# Patient Record
Sex: Male | Born: 1975 | Race: Black or African American | Hispanic: No | Marital: Married | Smoking: Current every day smoker
Health system: Southern US, Community
[De-identification: ages and names within clinical notes are randomized; demographics above are authoritative.]

## PROBLEM LIST (undated history)

## (undated) DIAGNOSIS — T7840XA Allergy, unspecified, initial encounter: Secondary | ICD-10-CM

## (undated) DIAGNOSIS — D573 Sickle-cell trait: Secondary | ICD-10-CM

## (undated) HISTORY — DX: Allergy, unspecified, initial encounter: T78.40XA

## (undated) HISTORY — PX: UMBILICAL HERNIA REPAIR: SHX196

---

## 2012-12-05 HISTORY — PX: ENUCLEATION: SHX628

## 2015-10-25 ENCOUNTER — Emergency Department (HOSPITAL_COMMUNITY): Payer: Self-pay

## 2015-10-25 ENCOUNTER — Emergency Department (HOSPITAL_COMMUNITY)
Admission: EM | Admit: 2015-10-25 | Discharge: 2015-10-25 | Disposition: A | Discharge status: Home / Self Care | Payer: Self-pay | Attending: Emergency Medicine | Admitting: Emergency Medicine

## 2015-10-25 ENCOUNTER — Encounter (HOSPITAL_COMMUNITY): Payer: Self-pay | Admitting: Emergency Medicine

## 2015-10-25 DIAGNOSIS — W228XXA Striking against or struck by other objects, initial encounter: Secondary | ICD-10-CM | POA: Insufficient documentation

## 2015-10-25 DIAGNOSIS — Y9389 Activity, other specified: Secondary | ICD-10-CM | POA: Insufficient documentation

## 2015-10-25 DIAGNOSIS — Y929 Unspecified place or not applicable: Secondary | ICD-10-CM | POA: Insufficient documentation

## 2015-10-25 DIAGNOSIS — S62339A Displaced fracture of neck of unspecified metacarpal bone, initial encounter for closed fracture: Secondary | ICD-10-CM

## 2015-10-25 DIAGNOSIS — S62306A Unspecified fracture of fifth metacarpal bone, right hand, initial encounter for closed fracture: Secondary | ICD-10-CM | POA: Insufficient documentation

## 2015-10-25 DIAGNOSIS — Y999 Unspecified external cause status: Secondary | ICD-10-CM | POA: Insufficient documentation

## 2015-10-25 MED ORDER — NAPROXEN 250 MG PO TABS: 250.0000 mg | ORAL_TABLET | Freq: Two times a day (BID) | ORAL | 0 refills | Status: DC

## 2015-10-25 NOTE — ED Notes (Signed)
Patient transported to X-ray 

## 2015-10-25 NOTE — ED Notes (Signed)
Bed: [id number] Expected date:  Expected time:  Means of arrival:  Comments: 40 yo sore throat

## 2015-10-25 NOTE — ED Notes (Signed)
Bed: WA27 Expected date:  Expected time:  Means of arrival:  Comments: 

## 2015-10-25 NOTE — ED Triage Notes (Signed)
Pt reports that he punched a cabinet Thursday.  Reports no pain but does still have some swelling.  Pt has full ROM with hand and wrist.

## 2015-10-25 NOTE — ED Provider Notes (Signed)
[facility]-EMERGENCY DEPT Provider Note   CSN: [id number] Arrival date & time: 10/25/15  1248   By signing my name below, I, Christel MormonMatthew Jamison, attest that this documentation has been prepared under the direction and in the presence of Everlene FarrierWilliam Mariel Gaudin, PA-C . Electronically Signed: Christel MormonMatthew Jamison, Scribe. 10/25/2015. 1:03 PM.   History   Chief Complaint Chief Complaint  Patient presents with  . Hand Injury    The history is provided by the patient. No language interpreter was used.    History reviewed. No pertinent past medical history.  There are no active problems to display for this patient.   History reviewed. No pertinent surgical history.   HPI Comments:  Kevin Burch is a 40 y.o. male who presents to the Emergency Department sudden onset, unchanged, moderate right hand injury onset 3 days ago after he punched a cabinet. Pt has associated right hand swelling. He does not have any current pain.  He is here due to punching a cabinet 3 days ago. Pt reports no pain currently. Pt notes using ice with no relief.  He has not taken any medication to alleviate his pain. Pt denies numbness, tingling, or weakness.       Home Medications    Prior to Admission medications   Medication Sig Start Date End Date Taking? Authorizing Provider  naproxen (NAPROSYN) 250 MG tablet Take 1 tablet (250 mg total) by mouth 2 (two) times daily with a meal. 10/25/15   Everlene FarrierWilliam Kady Toothaker, PA-C    Family History History reviewed. No pertinent family history.  Social History Social History  Substance Use Topics  . Smoking status: Not on file  . Smokeless tobacco: Not on file  . Alcohol use Not on file     Allergies   Review of patient's allergies indicates no known allergies.   Review of Systems Review of Systems  Musculoskeletal: Positive for arthralgias and joint swelling.  Neurological: Negative for weakness and numbness.     Physical Exam Updated Vital Signs BP 150/93 (BP Location:  Right Arm)   Pulse 88   Temp 97.8 F (36.6 C) (Oral)   Resp 16   Ht 5\' 11"  (1.803 m)   Wt 88.5 kg   SpO2 100%   BMI 27.20 kg/m   Physical Exam  Constitutional: He appears well-developed and well-nourished. No distress.  HENT:  Head: Normocephalic and atraumatic.  Eyes: Right eye exhibits no discharge. Left eye exhibits no discharge.  Cardiovascular: Normal rate, regular rhythm and intact distal pulses.   Bilateral radial pulses are intact.  Pulmonary/Chest: Effort normal. No respiratory distress.  Musculoskeletal: Normal range of motion. He exhibits edema (3rd, 4th, 5th R metacarpels). He exhibits no tenderness or deformity.  Can make a fist. R knuckles in good alignment.  There is mild edema overlying the fifth metacarpal. No right hand ecchymosis or erythema. No obvious deformity.  Neurological: He is alert. Coordination normal.  Sensation is intact in his bilateral distal fingertips.  Skin: Skin is warm and dry. Capillary refill takes less than 2 seconds. Good cap refill. No rash noted. He is not diaphoretic. No erythema. No pallor.  Psychiatric: He has a normal mood and affect. His behavior is normal.  Nursing note and vitals reviewed.    ED Treatments / Results  DIAGNOSTIC STUDIES:  Oxygen Saturation is 100% on RA, normal by my interpretation.    COORDINATION OF CARE:  1:03 PM Will order an x-ray on right hand. Discussed treatment plan with pt at bedside and pt agreed  to plan.  Labs (all labs ordered are listed, but only abnormal results are displayed) Labs Reviewed - No data to display  EKG  EKG Interpretation None       Radiology Dg Hand Complete Right  Result Date: 10/25/2015 CLINICAL DATA:  Swelling of the right fifth metacarpal. Punched wall 3 days ago. EXAM: RIGHT HAND - COMPLETE 3+ VIEW COMPARISON:  None. FINDINGS: Typical boxer's type fracture of the diaphyseal metaphyseal junction of the fifth metacarpal with dorsal angulation. No other regional  fracture. IMPRESSION: Typical boxer's fracture of the fifth metacarpal. Electronically Signed   By: Paulina FusiMark  Shogry M.D.   On: 10/25/2015 13:49    Procedures Procedures (including critical care time)  Medications Ordered in ED Medications - No data to display   Initial Impression / Assessment and Plan / ED Course  I have reviewed the triage vital signs and the nursing notes.  Pertinent labs & imaging results that were available during my care of the patient were reviewed by me and considered in my medical decision making (see chart for details).  Clinical Course    Patient presents to the emergency department complaining of right hand pain after he punched a wall 3 days ago. X-ray shows boxer's fracture. On exam patient has mild edema overlying his fifth metacarpal. No obvious deformity. He is able to make a fist in his knuckles are in good alignment. Patient placed in an ulnar gutter splint and will follow-up with hand surgeon Dr. Merlyn LotKuzma. Naproxen for pain control. I discussed cast care and return precautions.  Final Clinical Impressions(s) / ED Diagnoses   Final diagnoses:  Boxer's fracture, closed, initial encounter    New Prescriptions New Prescriptions   NAPROXEN (NAPROSYN) 250 MG TABLET    Take 1 tablet (250 mg total) by mouth 2 (two) times daily with a meal.   I personally performed the services described in this documentation, which was scribed in my presence. The recorded information has been reviewed and is accurate.        Everlene FarrierWilliam Jarred Purtee, PA-C 10/25/15 1425    Charlynne Panderavid Hsienta Yao, MD 10/25/15 [phone]

## 2017-09-04 ENCOUNTER — Emergency Department (HOSPITAL_COMMUNITY)
Admission: EM | Admit: 2017-09-04 | Discharge: 2017-09-04 | Disposition: A | Discharge status: Home / Self Care | Payer: Self-pay | Attending: Emergency Medicine | Admitting: Emergency Medicine

## 2017-09-04 ENCOUNTER — Other Ambulatory Visit: Payer: Self-pay

## 2017-09-04 ENCOUNTER — Emergency Department (HOSPITAL_COMMUNITY): Payer: Self-pay

## 2017-09-04 ENCOUNTER — Encounter (HOSPITAL_COMMUNITY): Payer: Self-pay | Admitting: Emergency Medicine

## 2017-09-04 DIAGNOSIS — N50819 Testicular pain, unspecified: Secondary | ICD-10-CM | POA: Insufficient documentation

## 2017-09-04 DIAGNOSIS — Z79899 Other long term (current) drug therapy: Secondary | ICD-10-CM | POA: Insufficient documentation

## 2017-09-04 DIAGNOSIS — F17210 Nicotine dependence, cigarettes, uncomplicated: Secondary | ICD-10-CM | POA: Insufficient documentation

## 2017-09-04 LAB — URINALYSIS, ROUTINE W REFLEX MICROSCOPIC
BILIRUBIN URINE: NEGATIVE
Glucose, UA: NEGATIVE mg/dL
HGB URINE DIPSTICK: NEGATIVE
KETONES UR: NEGATIVE mg/dL
Leukocytes, UA: NEGATIVE
NITRITE: NEGATIVE
PH: 6 (ref 5.0–8.0)
Protein, ur: NEGATIVE mg/dL
Specific Gravity, Urine: 1.012 (ref 1.005–1.030)

## 2017-09-04 MED ORDER — OXYCODONE-ACETAMINOPHEN 5-325 MG PO TABS: 1.0000 | ORAL_TABLET | Freq: Once | ORAL | Status: DC

## 2017-09-04 MED ORDER — NAPROXEN 250 MG PO TABS: 250.0000 mg | ORAL_TABLET | Freq: Two times a day (BID) | ORAL | 0 refills | Status: DC | PRN

## 2017-09-04 NOTE — Discharge Instructions (Signed)
Your ultrasound and urine tests today were normal.  Follow-up with urologist if symptoms persist.  We recommend scrotal support and elevation.  Take naproxen as needed for discomfort.

## 2017-09-04 NOTE — ED Notes (Signed)
Patient Alert and oriented to baseline. Stable and ambulatory to baseline. Patient verbalized understanding of the discharge instructions.  Patient belongings were taken by the patient.   

## 2017-09-04 NOTE — ED Triage Notes (Signed)
Patient to ED c/o R testicular swelling and tenderness x 2 days. Patient denies injury. Denies pain at rest, no N/V or urinary symptoms.

## 2017-09-04 NOTE — ED Provider Notes (Signed)
Patient placed in Quick Look pathway, seen and evaluated   Chief Complaint: pain right scrotum  HPI:   Pt complains of swelling and discomfort for 1 week   ROS:  No fever, no chills  Physical Exam:   Gen: No distress  Neuro: Awake and Alert  Skin: Warm    Focused Exam: swelling right scrotum, tenderness to palpation   Ultrasound and ua ordered  Initiation of care has begun. The patient has been counseled on the process, plan, and necessity for staying for the completion/evaluation, and the remainder of the medical screening examination   Osie CheeksSofia, Jarett Dralle K, PA-C 09/04/17 2341    Arby BarrettePfeiffer, Marcy, MD 09/12/17 1250
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
## 2018-07-03 IMAGING — US US SCROTUM W/ DOPPLER COMPLETE
1 series · 14 of 25 positions shown · non-contrast
Comparison: None.

CLINICAL DATA: Initial evaluation for acute right testicular pain
and swelling for 2 days.

EXAM:
SCROTAL ULTRASOUND
DOPPLER ULTRASOUND OF THE TESTICLES
TECHNIQUE: Complete ultrasound examination of the testicles, epididymis, and
other scrotal structures was performed. Color and spectral Doppler
ultrasound were also utilized to evaluate blood flow to the
testicles.

[Series 1: us scrotum w/ doppler complete · 0.06mm/px · 14 of 51 slices shown]
[im 1/51]
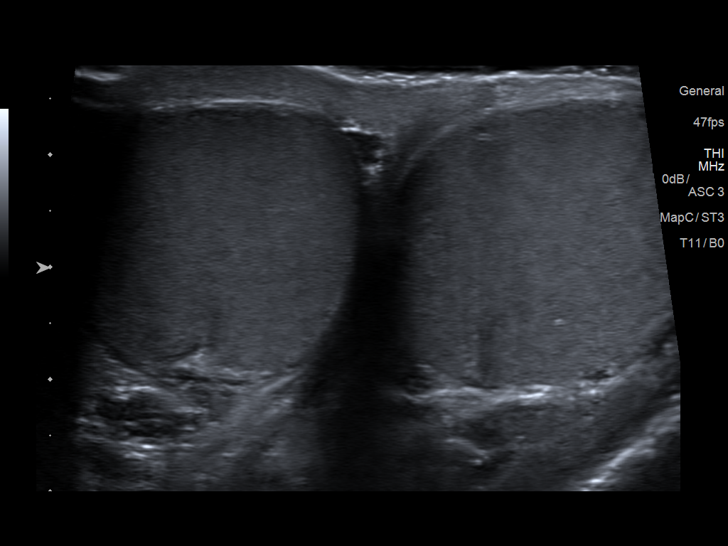
[im 5/51]
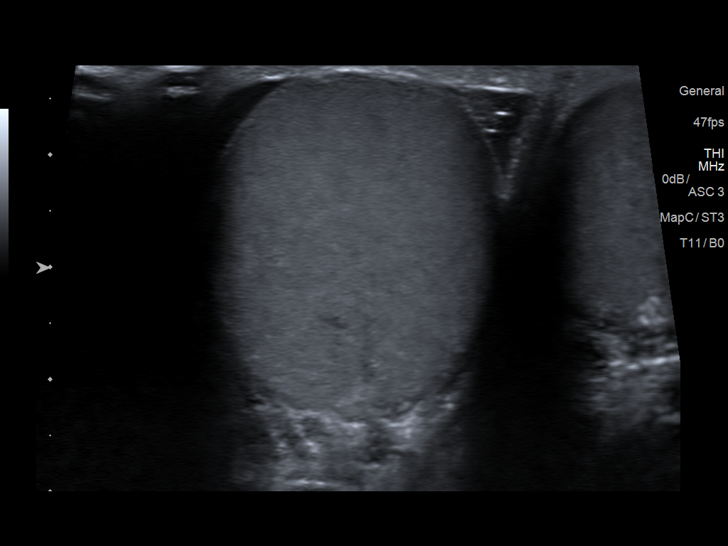
[im 9/51]
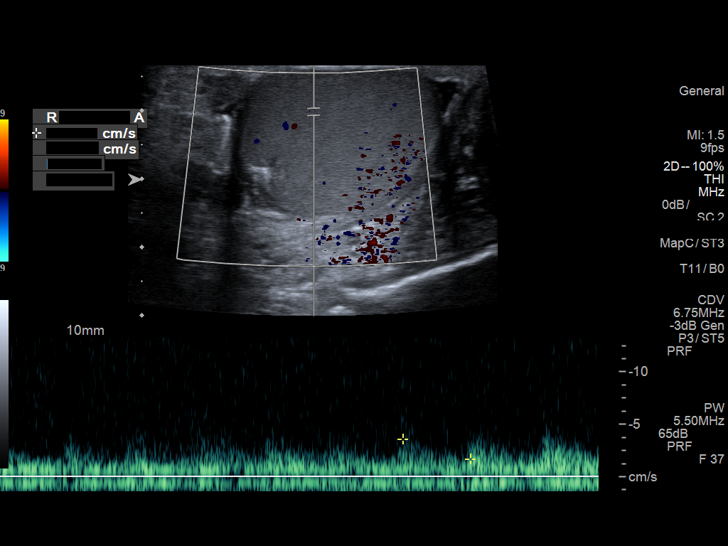
[im 13/51]
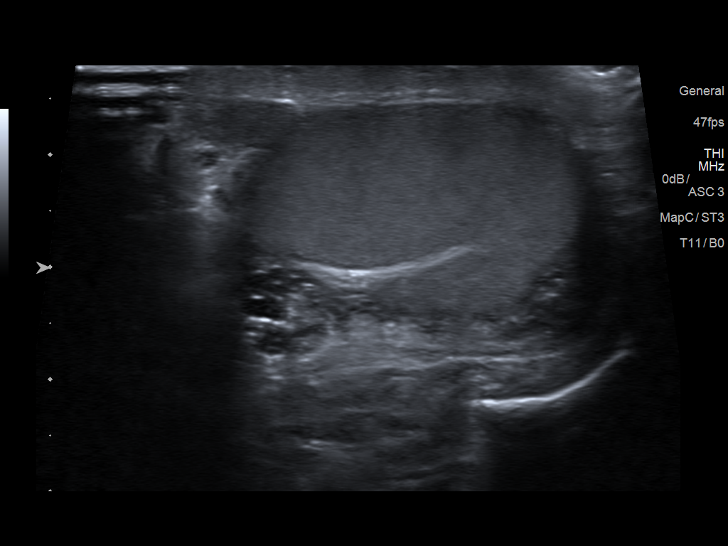
[im 17/51]
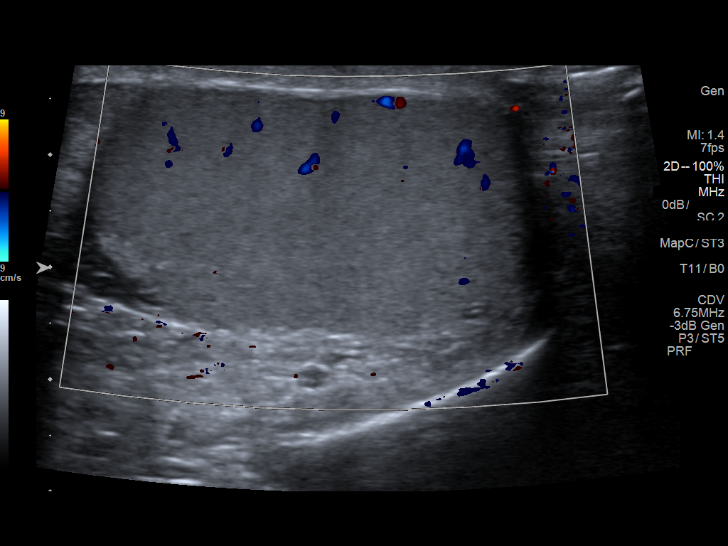
[im 19/51]
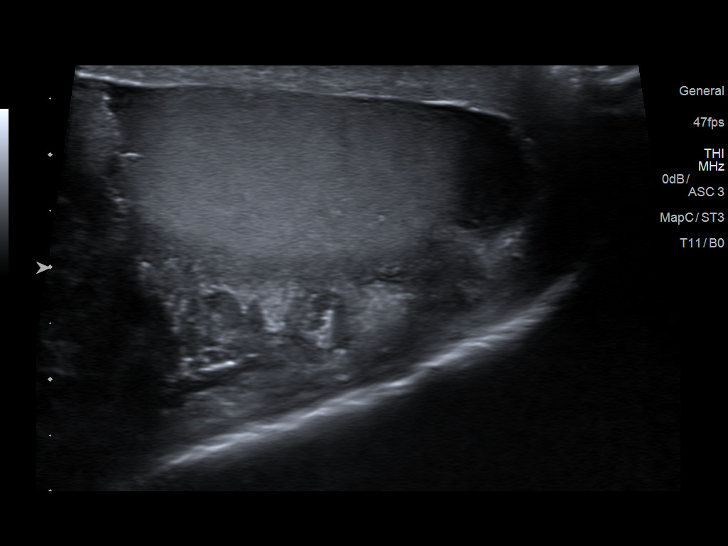
[im 23/51]
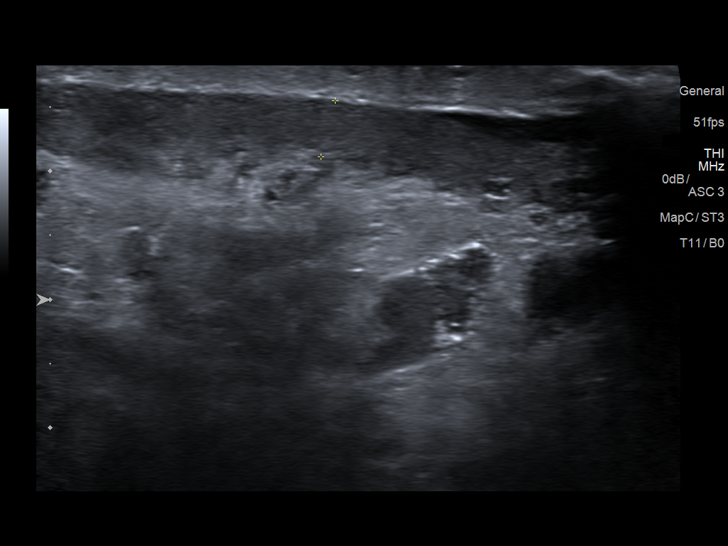
[im 28/51]
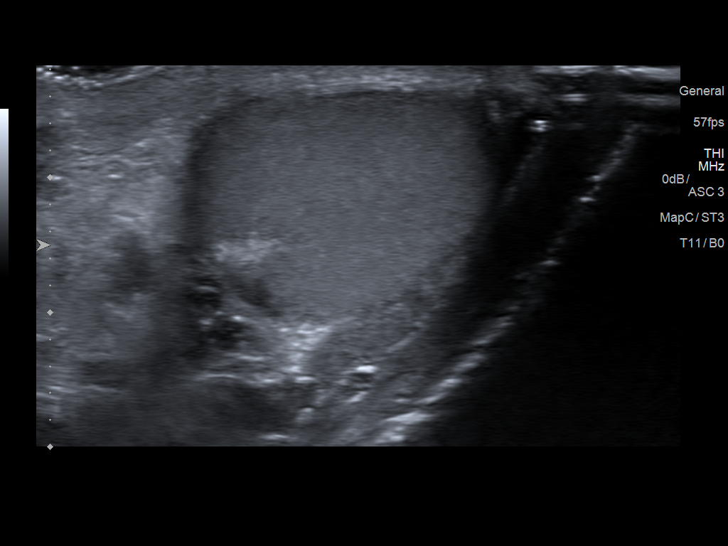
[im 32/51]
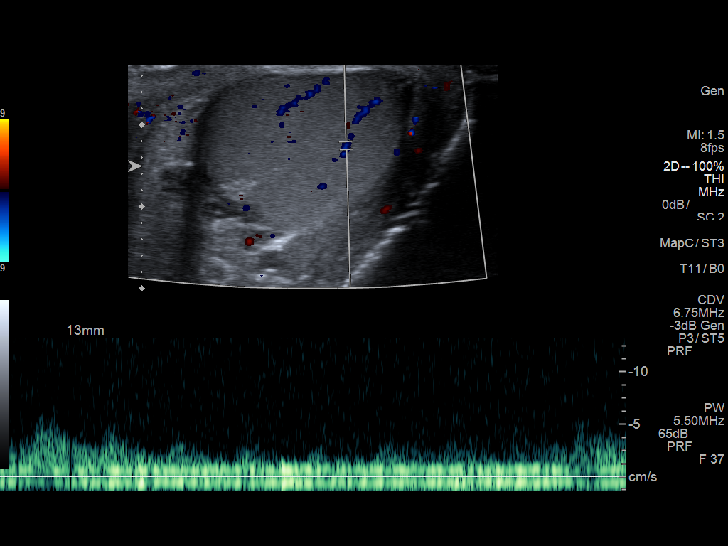
[im 34/51]
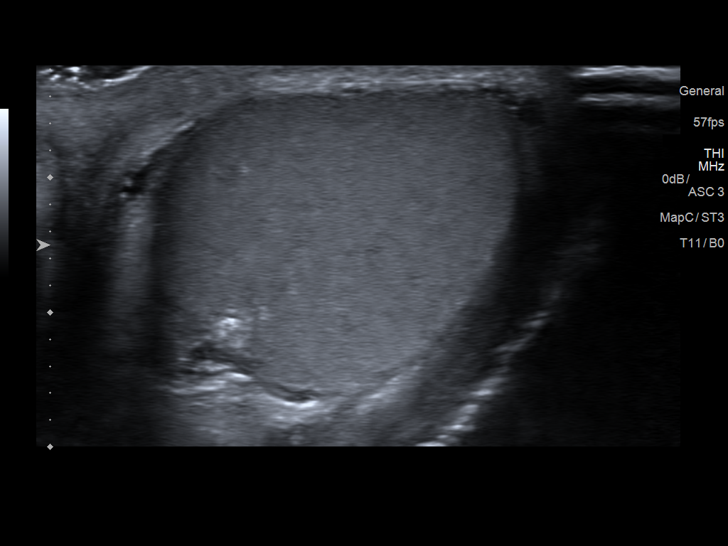
[im 38/51]
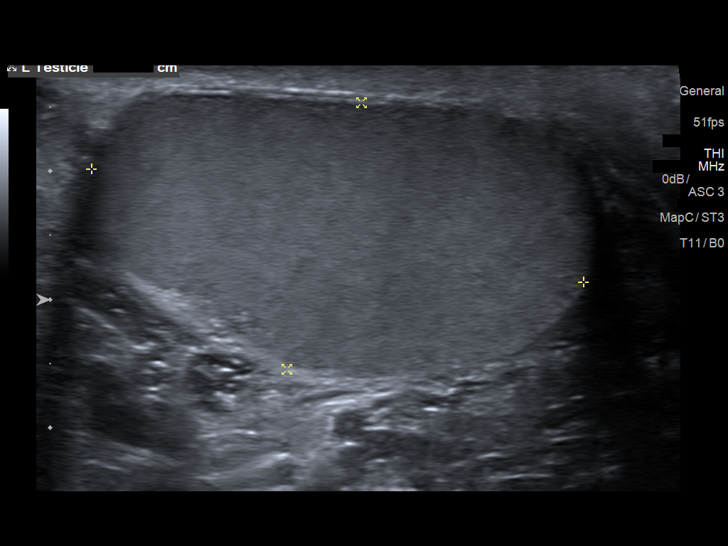
[im 42/51]
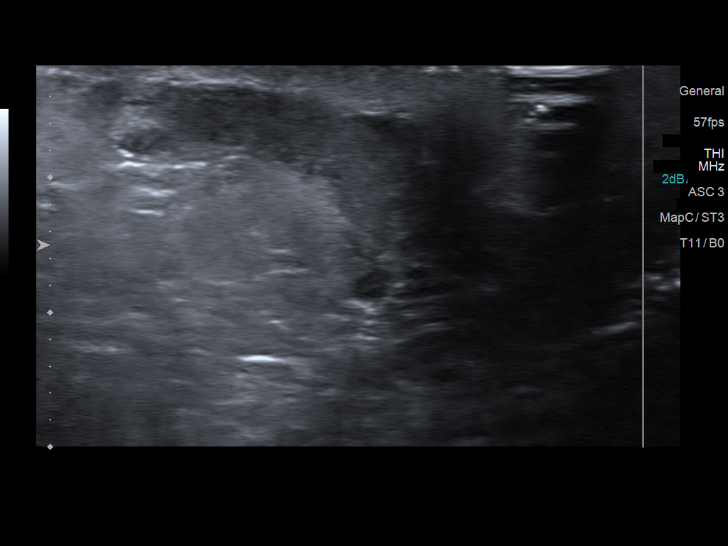
[im 46/51]
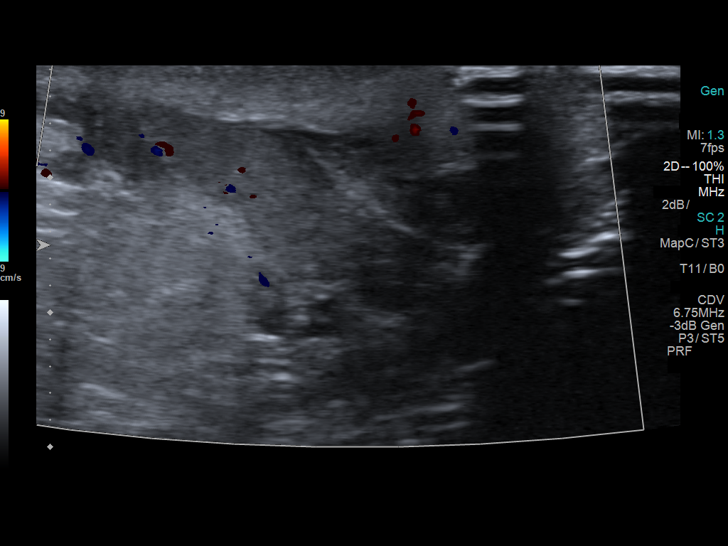
[im 51/51]
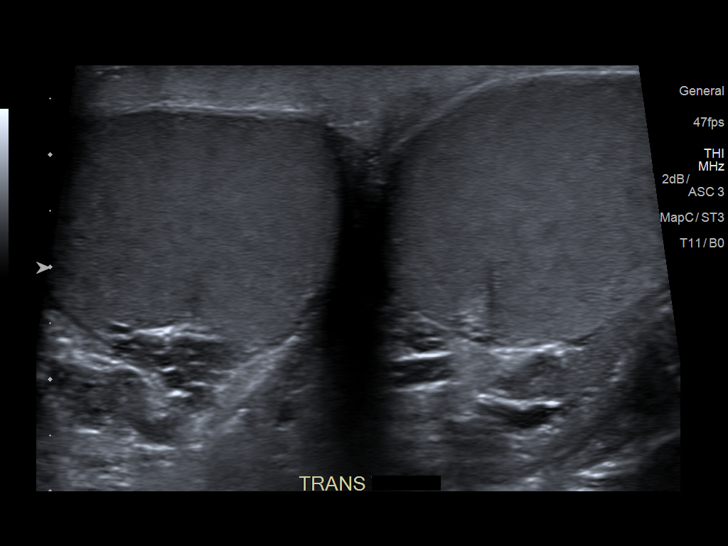

[14 of 25 positions shown; findings below may reference images not displayed]

FINDINGS: Right testicle

Measurements: 4.0 x 2.4 x 2.6 cm. No mass or microlithiasis
visualized.

Left testicle

Measurements: 4.0 x 2.2 x 2.7 cm. No mass or microlithiasis
visualized.

Right epididymis:  Normal in size and appearance.

Left epididymis:  Normal in size and appearance.

Hydrocele:  None visualized.

Varicocele:  None visualized.

Pulsed Doppler interrogation of both testes demonstrates normal low
resistance arterial and venous waveforms bilaterally.
IMPRESSION: Normal scrotal ultrasound. No evidence for torsion or other acute
abnormality.

## 2018-07-12 ENCOUNTER — Ambulatory Visit (INDEPENDENT_AMBULATORY_CARE_PROVIDER_SITE_OTHER): Payer: Self-pay | Admitting: Family Medicine

## 2018-07-12 ENCOUNTER — Encounter: Payer: Self-pay | Admitting: Family Medicine

## 2018-07-12 VITALS — Temp 97.0°F | Ht 71.0 in | Wt 215.0 lb

## 2018-07-12 DIAGNOSIS — H5789 Other specified disorders of eye and adnexa: Secondary | ICD-10-CM

## 2018-07-12 MED ORDER — POLYMYXIN B-TRIMETHOPRIM 10000-0.1 UNIT/ML-% OP SOLN: 2.0000 [drp] | Freq: Four times a day (QID) | OPHTHALMIC | 0 refills | Status: AC
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
## 2018-07-20 ENCOUNTER — Telehealth: Payer: Self-pay | Admitting: Family Medicine

## 2018-07-20 MED ORDER — ERYTHROMYCIN 5 MG/GM OP OINT: 1.0000 "application " | TOPICAL_OINTMENT | Freq: Three times a day (TID) | OPHTHALMIC | 0 refills | Status: AC

## 2018-07-20 NOTE — Telephone Encounter (Signed)
Dr.C please advise

## 2018-07-20 NOTE — Telephone Encounter (Signed)
Copied from CRM [id number]. Topic: Quick Communication - See Telephone Encounter >> Jul 20, 2018  2:43 PM Terisa Starr wrote: CRM for notification. See Telephone encounter for: 07/20/18. Patient said the eye drops are not working, he rather have an ointment called in. He said he he has been taking them 7 days and sees no improvement. Please Advise.

## 2018-07-20 NOTE — Telephone Encounter (Signed)
Will send Rx for abx ointment

## 2018-07-22 ENCOUNTER — Emergency Department (HOSPITAL_COMMUNITY)
Admission: EM | Admit: 2018-07-22 | Discharge: 2018-07-22 | Disposition: A | Discharge status: Home / Self Care | Payer: Self-pay | Attending: Emergency Medicine | Admitting: Emergency Medicine

## 2018-07-22 ENCOUNTER — Other Ambulatory Visit: Payer: Self-pay

## 2018-07-22 DIAGNOSIS — Z79899 Other long term (current) drug therapy: Secondary | ICD-10-CM | POA: Insufficient documentation

## 2018-07-22 DIAGNOSIS — F17210 Nicotine dependence, cigarettes, uncomplicated: Secondary | ICD-10-CM | POA: Insufficient documentation

## 2018-07-22 DIAGNOSIS — H1031 Unspecified acute conjunctivitis, right eye: Secondary | ICD-10-CM

## 2018-07-22 DIAGNOSIS — H10021 Other mucopurulent conjunctivitis, right eye: Secondary | ICD-10-CM | POA: Insufficient documentation

## 2018-07-22 MED ORDER — TOBRAMYCIN 0.3 % OP OINT
TOPICAL_OINTMENT | Freq: Once | OPHTHALMIC | Status: AC
  Administered 2018-07-22: 07:00:00 via OPHTHALMIC
  Filled 2018-07-22 (×2): qty 3.5

## 2018-07-22 NOTE — ED Triage Notes (Addendum)
Last week was diagnosed with "eye infection"?... Prescribed a week's worth of antibiotics which he recently completed. Assures he used antibitoitc as presribed. However still having moderate serous drainage from R eye. Previous work injury caused patient to lose R eye.

## 2018-07-22 NOTE — ED Provider Notes (Signed)
[facility] Provider Note   CSN: [id number] Arrival date & time: 07/22/18  0557    History   Chief Complaint Chief Complaint  Patient presents with  . Eye Drainage    HPI Kevin Burch is a 43 y.o. male.     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  Patient was prescribed Polytrim drops by his primary care doctor which she has taken without improvement.  He called back to try to get an appointment but was not prescribed this yet.  Patient presents with continued thick drainage from the right eye.  He denies redness, pain, or swelling around the orbit.  No fevers, nausea or vomiting.  He has not had similar infections in the past.  He does wear a eye covering in the socket.  He states that the discharge is starting to have an odor to it.  [facility].     Past Medical History:  Diagnosis Date  . Allergy     There are no active problems to display for this patient.   Past Surgical History:  Procedure Laterality Date  . ENUCLEATION Right 12/2012  . UMBILICAL HERNIA REPAIR          Home Medications    Prior to Admission medications   Medication Sig Start Date End Date Taking? Authorizing Provider  erythromycin ophthalmic ointment Place 1 application into the right eye 3 (three) times daily for 7 days. 07/20/18 07/27/18  Cirigliano, Jearld LeschMary K, DO  methocarbamol (ROBAXIN) 500 MG tablet Take 500 mg by mouth 3 (three) times daily. 08/05/16   [provider]  naproxen (NAPROSYN) 250 MG tablet Take 1 tablet (250 mg total) by mouth 2 (two) times daily as needed for mild pain or moderate pain. Patient not taking: Reported on 07/12/2018 09/04/17   Antony MaduraHumes, Kelly, PA-C    Family History No family history on file.  Social History Social History   Tobacco Use  . Smoking status:  Current Every Day Smoker    Packs/day: 1.00    Types: Cigarettes  . Smokeless tobacco: Never Used  Substance Use Topics  . Alcohol use: Yes    Comment: 5 x / week  . Drug use: Never     Allergies   Patient has no known allergies.   Review of Systems Review of Systems  Constitutional: Negative for fever.  Eyes: Positive for discharge. Negative for pain.  Gastrointestinal: Negative for nausea and vomiting.     Physical Exam Updated Vital Signs BP (!) 143/96 (BP Location: Right Arm)   Pulse 83   Temp 98.4 F (36.9 C) (Oral)   SpO2 97%   Physical Exam Vitals signs and nursing note reviewed.  Constitutional:      Appearance: He is well-developed.  HENT:     Head: Normocephalic and atraumatic.  Eyes:     Conjunctiva/sclera:     Right eye: Right eye conjunctiva injected: Deep erythema with mild edema noted of the inner surface of the right lower eyelid. Exudate present. No chemosis.    Left eye: Left conjunctiva is not injected. No exudate.  Neck:     Musculoskeletal: Normal range of motion and neck supple.  Pulmonary:     Effort: No respiratory distress.  Skin:    General: Skin is warm and dry.  Neurological:     Mental  Status: He is alert.      ED Treatments / Results  Labs (all labs ordered are listed, but only abnormal results are displayed) Labs Reviewed - No data to display  EKG None  Radiology No results found.  Procedures Procedures (including critical care time)  Medications Ordered in ED Medications  tobramycin (TOBREX) 0.3 % ophthalmic ointment (has no administration in time range)     Initial Impression / Assessment and Plan / ED Course  I have reviewed the triage vital signs and the nursing notes.  Pertinent labs & imaging results that were available during my care of the patient were reviewed by me and considered in my medical decision making (see chart for details).        Patient seen and examined. Work-up initiated.  Medications ordered.   Vital signs reviewed and are as follows: BP 127/90   Pulse 83   Temp 98.4 F (36.9 C) (Oral)   SpO2 97%   Tobramycin ointment ordered.  [facility].  Encouraged to return with worsening redness, pain, swelling around the orbit or if he develops a fever.  Final Clinical Impressions(s) / ED Diagnoses   Final diagnoses:  Acute bacterial conjunctivitis of right eye   Patient with discharge and exudate from right eye socket.  It has the appearance of conjunctivitis.  Conjunctival lining of the lower eyelid is inflamed.  No signs of pre-or post septal cellulitis.  Patient will be prescribed tobramycin ointment to cover for pseudomonal infection.  He is encouraged to follow-up with an ophthalmologist to ensure resolution of infection.  Return instructions as above.  ED Discharge Orders    None       Renne Crigler, Cordelia Poche 07/22/18 5790    Ward, Layla Maw, DO 07/22/18 [phone]

## 2018-07-22 NOTE — Discharge Instructions (Signed)
Please read and follow all provided instructions.  Your diagnoses today include:  1. Acute bacterial conjunctivitis of right eye    Tests performed today include:  Vital signs. See below for your results today.   Medications prescribed:   Tobrex (tobramycin) - antibiotic eye ointment  Use this medication as follows:  Apply 1/2" of the antibiotic ointment to affected eye 3 times per day while awake  Take any prescribed medications only as directed.  Home care instructions:  If you have an eye infection, wash your hands often as this is very contagious and is easily spread from person to person.   Follow-up instructions: Please follow-up with your ophthalmologist or the ophthalmologist referral in the next 72 hours to ensure that the infection is improving.  Return instructions:   Please return to the Emergency Department if you experience worsening symptoms.   Please return immediately if you develop severe pain, redness or swelling around the orbit of the eye, or fever.  Please return if you have any other emergent concerns.  Additional Information:  Your vital signs today were: BP 127/90    Pulse 83    Temp 98.4 F (36.9 C) (Oral)    SpO2 97%  If your blood pressure (BP) was elevated above 135/85 this visit, please have this repeated by your doctor within one month. ---------------

## 2018-07-23 NOTE — Telephone Encounter (Signed)
Left Pt detailed VM letting pt know we sent in new rx and if he had any questions to let us know.

## 2018-07-25 ENCOUNTER — Other Ambulatory Visit: Payer: Self-pay | Admitting: Family Medicine

## 2018-07-25 NOTE — Progress Notes (Signed)
Opened in error

## 2018-08-21 ENCOUNTER — Ambulatory Visit
Admission: EM | Admit: 2018-08-21 | Discharge: 2018-08-21 | Disposition: A | Discharge status: Home / Self Care | Payer: Self-pay | Attending: Family Medicine | Admitting: Family Medicine

## 2018-08-21 DIAGNOSIS — H00033 Abscess of eyelid right eye, unspecified eyelid: Secondary | ICD-10-CM

## 2018-08-21 MED ORDER — DOXYCYCLINE HYCLATE 100 MG PO TABS: 100.0000 mg | ORAL_TABLET | Freq: Two times a day (BID) | ORAL | 0 refills | Status: DC

## 2018-08-21 NOTE — Discharge Instructions (Addendum)
The skin is infected and oozing the yellow pus.  You need an antibiotic in the blood system (by mouth) to stop this.

## 2018-08-21 NOTE — ED Provider Notes (Addendum)
[facility]    CSN: [id number] Arrival date & time: 08/21/18  1439     History   Chief Complaint Chief Complaint  Patient presents with  . Eye Drainage    HPI Kevin Burch is a 43 y.o. male.   Pt c/o drainage from rt eye since April/2020. States has been treated multiple times with no relief.   He has used erythromycin ointment, tobrex, and polytrim.    He drives a forklift.  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  Patient was prescribed Polytrim drops by his primary care doctor which she has taken without improvement.  He called back to try to get an appointment but was not prescribed this yet.  Patient presents with continued thick drainage from the right eye.  He denies redness, pain, or swelling around the orbit.  No fevers, nausea or vomiting.  He has not had similar infections in the past.  He does wear a eye covering in the socket.  He states that the discharge is starting to have an odor to it.  He is cared for at the [facility].  Treated with tobrex ointment.     Past Medical History:  Diagnosis Date  . Allergy     There are no active problems to display for this patient.   Past Surgical History:  Procedure Laterality Date  . ENUCLEATION Right 12/2012  . UMBILICAL HERNIA REPAIR         Home Medications    Prior to Admission medications   Medication Sig Start Date End Date Taking? Authorizing Provider  doxycycline (VIBRA-TABS) 100 MG tablet Take 1 tablet (100 mg total) by mouth 2 (two) times daily. 08/21/18   Elvina SidleLauenstein, Kimble Hitchens, MD  methocarbamol (ROBAXIN) 500 MG tablet Take 500 mg by mouth 3 (three) times daily. 08/05/16   [provider]    Family History No family history on file.  Social History Social History   Tobacco Use  . Smoking status: Current Every Day Smoker     Packs/day: 1.00    Types: Cigarettes  . Smokeless tobacco: Never Used  Substance Use Topics  . Alcohol use: Yes    Comment: 5 x / week  . Drug use: Never     Allergies   Patient has no known allergies.   Review of Systems Review of Systems  Eyes: Positive for discharge.  All other systems reviewed and are negative.    Physical Exam Triage Vital Signs ED Triage Vitals  Enc Vitals Group     BP 08/21/18 1448 136/88     Pulse Rate 08/21/18 1448 94     Resp 08/21/18 1448 20     Temp 08/21/18 1448 98.1 F (36.7 C)     Temp Source 08/21/18 1448 Oral     SpO2 08/21/18 1448 97 %     Weight --      Height --      Head Circumference --      Peak Flow --      Pain Score 08/21/18 1449 0     Pain Loc --      Pain Edu? --      Excl. in GC? --    No data found.  Updated Vital Signs BP 136/88 (BP Location: Left Arm)   Pulse 94   Temp 98.1 F (36.7 C) (Oral)  Resp 20   SpO2 97%    Physical Exam Vitals signs and nursing note reviewed.  Constitutional:      Appearance: Normal appearance.  Eyes:     General:        Right eye: Discharge present.     Comments: Normal left eye  Neck:     Musculoskeletal: Normal range of motion and neck supple.  Pulmonary:     Effort: Pulmonary effort is normal.  Musculoskeletal: Normal range of motion.  Skin:    General: Skin is warm.     Findings: Erythema and rash present.  Neurological:     General: No focal deficit present.     Mental Status: He is alert and oriented to person, place, and time.  Psychiatric:        Mood and Affect: Mood normal.        UC Treatments / Results  Labs (all labs ordered are listed, but only abnormal results are displayed) Labs Reviewed - No data to display  EKG None  Radiology No results found.  Procedures Procedures (including critical care time)  Medications Ordered in UC Medications - No data to display  Initial Impression / Assessment and Plan / UC Course  I have  reviewed the triage vital signs and the nursing notes.  Pertinent labs & imaging results that were available during my care of the patient were reviewed by me and considered in my medical decision making (see chart for details).    Final Clinical Impressions(s) / UC Diagnoses   Final diagnoses:  Cellulitis of right eyelid     Discharge Instructions     The skin is infected and oozing the yellow pus.  You need an antibiotic in the blood system (by mouth) to stop this.    ED Prescriptions    Medication Sig Dispense Auth. Provider   doxycycline (VIBRA-TABS) 100 MG tablet Take 1 tablet (100 mg total) by mouth 2 (two) times daily. 20 tablet Robyn Haber, MD     Controlled Substance Prescriptions [place] Controlled Substance Registry consulted? Not Applicable   Robyn Haber, MD 08/21/18 1519    Robyn Haber, MD 08/21/18 [phone]

## 2018-08-21 NOTE — ED Triage Notes (Signed)
Pt c/o drainage from rt eye since April/2020. States has been treated multiple times with no relief.

## 2019-05-09 ENCOUNTER — Other Ambulatory Visit: Payer: Self-pay

## 2019-05-09 ENCOUNTER — Emergency Department (HOSPITAL_COMMUNITY)
Admission: EM | Admit: 2019-05-09 | Discharge: 2019-05-09 | Disposition: A | Discharge status: Home / Self Care | Payer: No Typology Code available for payment source | Attending: Emergency Medicine | Admitting: Emergency Medicine

## 2019-05-09 ENCOUNTER — Encounter (HOSPITAL_COMMUNITY): Payer: Self-pay | Admitting: Emergency Medicine

## 2019-05-09 ENCOUNTER — Emergency Department (HOSPITAL_BASED_OUTPATIENT_CLINIC_OR_DEPARTMENT_OTHER): Payer: No Typology Code available for payment source

## 2019-05-09 DIAGNOSIS — M79604 Pain in right leg: Secondary | ICD-10-CM | POA: Diagnosis not present

## 2019-05-09 DIAGNOSIS — F17210 Nicotine dependence, cigarettes, uncomplicated: Secondary | ICD-10-CM | POA: Diagnosis not present

## 2019-05-09 DIAGNOSIS — M79609 Pain in unspecified limb: Secondary | ICD-10-CM

## 2019-05-09 NOTE — Discharge Instructions (Signed)
As discussed, today's evaluation has been reassuring.  There is no evidence for a DVT or deep vein thrombosis in your leg.  There is suspicion for soft tissue inflammation, possibly due to repetitive motion.  Please obtain and use topical medicated patches including the ingredients lidocaine and methyl salicylate from any local pharmacy.  Use these as directed.  Please follow-up with our orthopedic colleagues for additional ongoing management.  Return here for any concerning changes in your condition.

## 2019-05-09 NOTE — ED Triage Notes (Signed)
Pt reports he thinks he has a blood clot in his right thigh. Pt reports pain in this area x 6 months. Describes pain as intermittent and pulsating. Pt has no hx of blood clots.

## 2019-05-09 NOTE — Progress Notes (Addendum)
Right lower extremity venous duplex completed. Refer to "CV Proc" under chart review to view preliminary results.  05/09/2019 2:04 PM Eula Fried., MHA, RVT, RDCS, RDMS

## 2019-05-09 NOTE — ED Provider Notes (Signed)
[facility]-EMERGENCY DEPT Provider Note   CSN: [id number] Arrival date & time: 05/09/19  1142     History Chief Complaint  Patient presents with  . Leg Pain    Kevin Burch is a 44 y.o. male.  HPI   Patient presents concern of pain in his right leg, suspicion of DVT. He notes that he is generally well though he did have a traumatic eye injury 1 year ago requiring enucleation of the eye.  He notes that over the past days, possibly weeks he has developed pain focally in the right lateral distal leg.  No appreciable swelling, no distal loss of strength or sensation.  No dyspnea, no chest pain.  With consideration of leg pain, after doing personal research he became concerned about possible DVT and presents for evaluation.  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  Past Medical History:  Diagnosis Date  . Allergy     There are no problems to display for this patient.   Past Surgical History:  Procedure Laterality Date  . ENUCLEATION Right 12/2012  . UMBILICAL HERNIA REPAIR         History reviewed. No pertinent family history.  Social History   Tobacco Use  . Smoking status: Current Every Day Smoker    Packs/day: 1.00    Types: Cigarettes  . Smokeless tobacco: Never Used  Substance Use Topics  . Alcohol use: Yes    Comment: 5 x / week  . Drug use: Never    Home Medications Prior to Admission medications   Medication Sig Start Date End Date Taking? Authorizing Provider  doxycycline (VIBRA-TABS) 100 MG tablet Take 1 tablet (100 mg total) by mouth 2 (two) times daily. 08/21/18   Elvina Sidle, MD  methocarbamol (ROBAXIN) 500 MG tablet Take 500 mg by mouth 3 (three) times daily. 08/05/16   [provider]    Allergies    Drug class [penicillins]  Review of Systems   Review of Systems  Constitutional:       Per HPI, otherwise negative  HENT:       Per HPI, otherwise negative  Respiratory:       Per HPI, otherwise  negative  Cardiovascular:       Per HPI, otherwise negative  Gastrointestinal: Negative for vomiting.  Endocrine:       Negative aside from HPI  Genitourinary:       Neg aside from HPI   Musculoskeletal:       Per HPI, otherwise negative  Skin: Negative.   Neurological: Negative for syncope.    Physical Exam Updated Vital Signs BP 132/90 (BP Location: Left Arm)   Pulse 94   Temp 98.7 F (37.1 C) (Oral)   Resp 18   Ht 5\' 11"  (1.803 m)   Wt 102.1 kg   SpO2 100%   BMI 31.38 kg/m   Physical Exam Vitals and nursing note reviewed.  Constitutional:      General: He is not in acute distress.    Appearance: He is well-developed.  HENT:     Head: Normocephalic and atraumatic.  Eyes:     Comments: Right eye prosthesis otherwise unremarkable  Cardiovascular:     Rate and Rhythm: Normal rate and regular rhythm.  Pulmonary:     Effort: Pulmonary effort is normal. No respiratory distress.     Breath sounds: No stridor.  Abdominal:     General: There is no distension.  Musculoskeletal:  Legs:  Skin:    General: Skin is warm and dry.  Neurological:     Mental Status: He is alert and oriented to person, place, and time.     ED Results / Procedures / Treatments    Radiology VAS [place] LOWER EXTREMITY VENOUS (DVT) (ONLY [facility] & [facility] 7a-7p)  Result Date: 05/09/2019  Lower Venous DVTStudy Indications: Pain.  Comparison Study: No prior study Performing Technologist: Gertie Fey MHA, RDMS, RVT, RDCS  Examination Guidelines: A complete evaluation includes B-mode imaging, spectral Doppler, color Doppler, and power Doppler as needed of all accessible portions of each vessel. Bilateral testing is considered an integral part of a complete examination. Limited examinations for reoccurring indications may be performed as noted. The reflux portion of the exam is performed with the patient in reverse Trendelenburg.   +---------+---------------+---------+-----------+----------+--------------+ RIGHT    CompressibilityPhasicitySpontaneityPropertiesThrombus Aging +---------+---------------+---------+-----------+----------+--------------+ CFV      Full           Yes      Yes                                 +---------+---------------+---------+-----------+----------+--------------+ SFJ      Full                                                        +---------+---------------+---------+-----------+----------+--------------+ FV Prox  Full                                                        +---------+---------------+---------+-----------+----------+--------------+ FV Mid   Full                                                        +---------+---------------+---------+-----------+----------+--------------+ FV DistalFull                                                        +---------+---------------+---------+-----------+----------+--------------+ PFV      Full                                                        +---------+---------------+---------+-----------+----------+--------------+ POP      Full           Yes      Yes                                 +---------+---------------+---------+-----------+----------+--------------+ PTV      Full                                                        +---------+---------------+---------+-----------+----------+--------------+  PERO     Full                                                        +---------+---------------+---------+-----------+----------+--------------+   Left Technical Findings: Not visualized segments include left common femoral vein due to patient position.   Summary: RIGHT: - There is no evidence of deep vein thrombosis in the lower extremity.  - No cystic structure found in the popliteal fossa.   *See table(s) above for measurements and observations.    Preliminary     Procedures Procedures  (including critical care time)  Medications Ordered in ED Medications - No data to display  ED Course  I have reviewed the triage vital signs and the nursing notes.  Pertinent labs & imaging results that were available during my care of the patient were reviewed by me and considered in my medical decision making (see chart for details).  2:10 PM Patient awake, alert, ambulatory. I discussed the ultrasound with the ultrasonographer, and subsequently with the patient himself. No evidence for DVT.  Given his preserved ambulatory capacity, reassuring findings, there is suspicion for musculoskeletal etiology, possibly secondary to repetitive use from driving a forklift. Patient amenable to topical meds, following up with orthopedics.   Final Clinical Impression(s) / ED Diagnoses Final diagnoses:  Right leg pain    Rx / DC Orders ED Discharge Orders    None       Carmin Muskrat, MD 05/09/19 1411

## 2019-08-03 ENCOUNTER — Emergency Department (HOSPITAL_COMMUNITY)
Admission: EM | Admit: 2019-08-03 | Discharge: 2019-08-03 | Disposition: A | Discharge status: Home / Self Care | Payer: No Typology Code available for payment source | Attending: Emergency Medicine | Admitting: Emergency Medicine

## 2019-08-03 ENCOUNTER — Other Ambulatory Visit: Payer: Self-pay

## 2019-08-03 DIAGNOSIS — L300 Nummular dermatitis: Secondary | ICD-10-CM | POA: Insufficient documentation

## 2019-08-03 DIAGNOSIS — L239 Allergic contact dermatitis, unspecified cause: Secondary | ICD-10-CM | POA: Insufficient documentation

## 2019-08-03 DIAGNOSIS — F17210 Nicotine dependence, cigarettes, uncomplicated: Secondary | ICD-10-CM | POA: Insufficient documentation

## 2019-08-03 DIAGNOSIS — R21 Rash and other nonspecific skin eruption: Secondary | ICD-10-CM | POA: Diagnosis present

## 2019-08-03 MED ORDER — HYDROCORTISONE 2.5 % EX LOTN: TOPICAL_LOTION | Freq: Two times a day (BID) | CUTANEOUS | 0 refills | Status: AC

## 2019-08-03 NOTE — ED Triage Notes (Signed)
Per patient, he has a rash on right arm starting 1 week ago. Patient states the only thing he has done differently is cut grass with short sleeves on.

## 2019-08-03 NOTE — ED Provider Notes (Signed)
[facility] DEPT Provider Note   CSN: [id number] Arrival date & time: 08/03/19  8242     History Chief Complaint  Patient presents with  . Rash    Kevin Burch is a 44 y.o. male with no relevant past medical history presents the ED with a 1 week history of rash.  Patient states that he has noticed many tiny bumps on his arms and legs.  He denies any rash to the face or torso.  He states that these bumps are very itchy.  He recently moved to a new home and states thinks that is attributed to all of the trees in his backyard or from mowing.  He describes the rashes "itchy".  He denies any new detergents or other skin products.  He has not tried anything for his symptoms.  He is followed at the [facility].  He denies any history of this happening previously, painful rash, precipitating injury, numbness or weakness, fevers or chills, recent illness or infection, new medications, or other symptoms.  HPI     Past Medical History:  Diagnosis Date  . Allergy     There are no problems to display for this patient.   Past Surgical History:  Procedure Laterality Date  . ENUCLEATION Right 12/2012  . UMBILICAL HERNIA REPAIR         No family history on file.  Social History   Tobacco Use  . Smoking status: Current Every Day Smoker    Packs/day: 1.00    Types: Cigarettes  . Smokeless tobacco: Never Used  Substance Use Topics  . Alcohol use: Yes    Comment: 5 x / week  . Drug use: Never    Home Medications Prior to Admission medications   Medication Sig Start Date End Date Taking? Authorizing Provider  doxycycline (VIBRA-TABS) 100 MG tablet Take 1 tablet (100 mg total) by mouth 2 (two) times daily. Patient not taking: Reported on 08/03/2019 08/21/18   Robyn Haber, MD  hydrocortisone 2.5 % lotion Apply topically 2 (two) times daily. 08/03/19   Corena Herter, PA-C    Allergies    Doxycycline and Drug class [penicillins]  Review of  Systems   Review of Systems  Constitutional: Negative for chills and fever.  Musculoskeletal: Negative for arthralgias.  Skin: Positive for rash. Negative for color change.  Neurological: Negative for weakness and numbness.    Physical Exam Updated Vital Signs BP (!) 142/92   Pulse 91   Temp 98.1 F (36.7 C)   Resp 18   Ht 5\' 11"  (1.803 m)   Wt 104.3 kg   SpO2 97%   BMI 32.08 kg/m   Physical Exam Vitals and nursing note reviewed. Exam conducted with a chaperone present.  Constitutional:      General: He is not in acute distress.    Appearance: Normal appearance. He is not ill-appearing.  HENT:     Head: Normocephalic and atraumatic.  Eyes:     General: No scleral icterus.    Conjunctiva/sclera: Conjunctivae normal.  Cardiovascular:     Rate and Rhythm: Normal rate and regular rhythm.     Pulses: Normal pulses.     Heart sounds: Normal heart sounds.  Pulmonary:     Effort: Pulmonary effort is normal.     Breath sounds: Normal breath sounds.  Skin:    General: Skin is dry.     Capillary Refill: Capillary refill takes less than 2 seconds.     Comments: Pinpoint papular  lesions diffusely on extremities.  No palmar or sole foot involvement.  No lesions in interdigit webspace.  No surrounding erythema.  Pruritic with evidence of excoriation.  Nontender to palpation.  No surrounding induration.  Larger 1 cm diameter annular macular lesion on left lower leg.  Neurological:     Mental Status: He is alert and oriented to person, place, and time.     GCS: GCS eye subscore is 4. GCS verbal subscore is 5. GCS motor subscore is 6.  Psychiatric:        Mood and Affect: Mood normal.        Behavior: Behavior normal.        Thought Content: Thought content normal.       ED Results / Procedures / Treatments   Labs (all labs ordered are listed, but only abnormal results are displayed) Labs Reviewed - No data to display  EKG None  Radiology No results  found.  Procedures Procedures (including critical care time)  Medications Ordered in ED Medications - No data to display  ED Course  I have reviewed the triage vital signs and the nursing notes.  Pertinent labs & imaging results that were available during my care of the patient were reviewed by me and considered in my medical decision making (see chart for details).    MDM Rules/Calculators/A&P                      Patient's history and physical exam is suggestive of a seasonal/contact dermatitis.  He states that his rash is itchy and denies any pain symptoms.  No fevers or chills.  Negative Nikolsky sign.  No new medications.  He does have 1 larger macular lesion on left lower leg that is 1 cm diameter.  It is annular and looks similar to nummular eczema.  Lower suspicion for a tinea corporis.  Will prescribe topical hydrocortisone 2% and encourage patient to use regular over-the-counter antihistamines for his pruritic symptoms.  Lower suspicion for tick bite, folliculitis, or other dermatologic conditions warranting antibiotic management.  No vesicular lesions concerning for poison oak dermatitis.  Encouraging patient to follow-up with his primary care provider at the [facility] regarding today's encounter.  Do not feel as though laboratory work-up would yield any significant findings.  Strict ED return precautions discussed with patient.  Patient voices understanding and is agreeable to the plan.  Final Clinical Impression(s) / ED Diagnoses Final diagnoses:  Nummular eczema  Allergic contact dermatitis, unspecified trigger    Rx / DC Orders ED Discharge Orders         Ordered    hydrocortisone 2.5 % lotion  2 times daily     08/03/19 1141           Elvera Maria 08/03/19 1141    Terald Sleeper, MD 08/03/19 1721

## 2019-08-03 NOTE — Discharge Instructions (Signed)
Please apply the hydrocortisone lotion to the affected areas.  I also encourage you to obtain an antihistamine such as Zyrtec over-the-counter for symptomatic relief of your itching.  Please follow-up with your primary care provider regarding today's encounter.  You may ultimately require referral to dermatology for ongoing evaluation if your symptoms do not improve with conservative management.  Please return to the ED or seek immediate medical attention should you experience any new or worsening symptoms.

## 2019-08-08 ENCOUNTER — Encounter (HOSPITAL_COMMUNITY): Payer: Self-pay | Admitting: Emergency Medicine

## 2019-08-08 ENCOUNTER — Emergency Department (HOSPITAL_COMMUNITY): Payer: No Typology Code available for payment source

## 2019-08-08 ENCOUNTER — Emergency Department (HOSPITAL_COMMUNITY)
Admission: EM | Admit: 2019-08-08 | Discharge: 2019-08-08 | Disposition: A | Discharge status: Home / Self Care | Payer: No Typology Code available for payment source | Attending: Emergency Medicine | Admitting: Emergency Medicine

## 2019-08-08 ENCOUNTER — Other Ambulatory Visit: Payer: Self-pay

## 2019-08-08 DIAGNOSIS — Y998 Other external cause status: Secondary | ICD-10-CM | POA: Insufficient documentation

## 2019-08-08 DIAGNOSIS — Y9389 Activity, other specified: Secondary | ICD-10-CM | POA: Diagnosis not present

## 2019-08-08 DIAGNOSIS — Y929 Unspecified place or not applicable: Secondary | ICD-10-CM | POA: Diagnosis not present

## 2019-08-08 DIAGNOSIS — X509XXA Other and unspecified overexertion or strenuous movements or postures, initial encounter: Secondary | ICD-10-CM | POA: Insufficient documentation

## 2019-08-08 DIAGNOSIS — S4991XA Unspecified injury of right shoulder and upper arm, initial encounter: Secondary | ICD-10-CM | POA: Diagnosis present

## 2019-08-08 DIAGNOSIS — F17210 Nicotine dependence, cigarettes, uncomplicated: Secondary | ICD-10-CM | POA: Insufficient documentation

## 2019-08-08 DIAGNOSIS — S46211A Strain of muscle, fascia and tendon of other parts of biceps, right arm, initial encounter: Secondary | ICD-10-CM | POA: Diagnosis not present

## 2019-08-08 MED ORDER — NAPROXEN 500 MG PO TABS: 500.0000 mg | ORAL_TABLET | Freq: Two times a day (BID) | ORAL | 0 refills | Status: AC | PRN

## 2019-08-08 MED ORDER — HYDROCODONE-ACETAMINOPHEN 5-325 MG PO TABS
1.0000 | ORAL_TABLET | Freq: Four times a day (QID) | ORAL | 0 refills | Status: DC | PRN
Start: 2019-08-08 — End: 2019-08-13

## 2019-08-08 NOTE — Progress Notes (Signed)
Orthopedic Tech Progress Note Patient Details:  Kevin Burch 1975/08/27 [id number]  Ortho Devices Type of Ortho Device: Sling immobilizer Ortho Device/Splint Location: right Ortho Device/Splint Interventions: Application   Post Interventions Patient Tolerated: Well Instructions Provided: Care of device   Saul Fordyce 08/08/2019, 2:19 PM

## 2019-08-08 NOTE — Discharge Instructions (Signed)
Please read and follow all provided instructions.  You have been seen today after an injury to your right arm.  Your x-ray did not show a fracture, however we are concerned that you have a biceps tendon rupture. Please keep your right arm in the splint provided.  Please follow-up with orthopedics within 3 days.  The orthopedic surgery office will likely call you to schedule your appointment. Please call orthopedic surgery tomorrow to schedule an appointment within the next 3 days.   Home care instructions: -- *PRICE in the first 24-48 hours after injury: Protect with splint Rest Ice- Do not apply ice pack directly to your splint place towel or similar between your splint and ice/ice pack. Apply ice for 20 min, then remove for 40 min while awake Compression- splint Elevate affected extremity above the level of your heart when not walking around for the first 24-48 hours   Medications:  - Naproxen is a nonsteroidal anti-inflammatory medication that will help with pain and swelling. Be sure to take this medication as prescribed with food, 1 pill every 12 hours,  It should be taken with food, as it can cause stomach upset, and more seriously, stomach bleeding. Do not take other nonsteroidal anti-inflammatory medications with this such as Advil, Motrin, Aleve, Mobic, Goodie Powder, or Motrin.    If your pain is not relieved by naproxen please take Percocet. -Percocet-this is a narcotic/controlled substance medication that has potential addicting qualities.  We recommend that you take 1-2 tablets every 6 hours as needed for severe pain.  Do not drive or operate heavy machinery when taking this medicine as it can be sedating. Do not drink alcohol or take other sedating medications when taking this medicine for safety reasons.  Keep this out of reach of small children.  Please be aware this medicine has Tylenol in it (325 mg/tab) do not exceed the maximum dose of Tylenol in a day per over the counter  recommendations should you decide to supplement with Tylenol over the counter.   We have prescribed you new medication(s) today. Discuss the medications prescribed today with your pharmacist as they can have adverse effects and interactions with your other medicines including over the counter and prescribed medications. Seek medical evaluation if you start to experience new or abnormal symptoms after taking one of these medicines, seek care immediately if you start to experience difficulty breathing, feeling of your throat closing, facial swelling, or rash as these could be indications of a more serious allergic reaction  Follow-up instructions: Please follow-up with the orthopedic surgeon in your discharge instructions 3-5 days  Return instructions:  Please return if your digits or extremity are numb or tingling, appear gray or blue, or you have severe pain (also elevate the extremity and loosen splint or wrap if you were given one) Please return if you have redness or fevers.  Please return to the Emergency Department if you experience worsening symptoms.  Please return if you have any other emergent concerns. Additional Information:  Your vital signs today were: BP (!) 144/99   Pulse 94   Temp 98.1 F (36.7 C) (Oral)   Resp 17   SpO2 99%  If your blood pressure (BP) was elevated above 135/85 this visit, please have this repeated by your doctor within one month. ---------------

## 2019-08-08 NOTE — ED Triage Notes (Signed)
Pt reports that picking up his daughter around 9am today and started having knot in right upper arm.

## 2019-08-08 NOTE — ED Provider Notes (Signed)
[facility]-EMERGENCY DEPT Provider Note   CSN: [id number] Arrival date & time: 08/08/19  1056     History Chief Complaint  Patient presents with  . arm injury    right    Kevin Burch is a 44 y.o. male with a history of tobacco abuse who presents to the ED with complaints of right upper arm pain after picking up his daughter this morning.  Patient states he picked up his daughter and felt a popping sensation in the right upper arm with onset of discomfort.  He states pain is constant, worse with movement, no alleviating factors.  He feels there is a knot in divot in the upper arm.  Denies numbness, tingling, weakness, or other areas of injury.  Patient states he is ambidextrous.  HPI     Past Medical History:  Diagnosis Date  . Allergy     There are no problems to display for this patient.   Past Surgical History:  Procedure Laterality Date  . ENUCLEATION Right 12/2012  . UMBILICAL HERNIA REPAIR         No family history on file.  Social History   Tobacco Use  . Smoking status: Current Every Day Smoker    Packs/day: 1.00    Types: Cigarettes  . Smokeless tobacco: Never Used  Substance Use Topics  . Alcohol use: Yes    Comment: 5 x / week  . Drug use: Never    Home Medications Prior to Admission medications   Medication Sig Start Date End Date Taking? Authorizing Provider  doxycycline (VIBRA-TABS) 100 MG tablet Take 1 tablet (100 mg total) by mouth 2 (two) times daily. Patient not taking: Reported on 08/03/2019 08/21/18   Elvina Sidle, MD  hydrocortisone 2.5 % lotion Apply topically 2 (two) times daily. 08/03/19   Lorelee New, PA-C    Allergies    Doxycycline and Drug class [penicillins]  Review of Systems   Review of Systems  Constitutional: Negative for chills and fever.  Respiratory: Negative for shortness of breath.   Cardiovascular: Negative for chest pain.  Gastrointestinal: Negative for nausea and vomiting.    Musculoskeletal: Positive for myalgias.  Skin: Negative for color change and wound.  Neurological: Negative for weakness and numbness.    Physical Exam Updated Vital Signs BP (!) 144/99   Pulse 94   Temp 98.1 F (36.7 C) (Oral)   Resp 17   SpO2 99%   Physical Exam Vitals and nursing note reviewed.  Constitutional:      General: He is not in acute distress.    Appearance: Normal appearance. He is not ill-appearing or toxic-appearing.  HENT:     Head: Normocephalic and atraumatic.  Neck:     Comments: No midline tenderness.  Cardiovascular:     Rate and Rhythm: Normal rate and regular rhythm.     Pulses:          Radial pulses are 2+ on the right side and 2+ on the left side.  Pulmonary:     Effort: Pulmonary effort is normal. No respiratory distress.     Breath sounds: Normal breath sounds.  Musculoskeletal:     Cervical back: Normal range of motion and neck supple.     Comments: Upper extremities: Patient has mid humeral shaft deformity with distal aspect of bicep being enlarged.  No erythema, warmth, or open wounds.  No ecchymosis.  Intact active range of motion throughout, does have some pain with shoulder flexion..  Able to  flex/extend the shoulder and elbow against resistance.  Patient has tenderness to palpation to the mid humeral shaft.  Upper extremities are otherwise nontender.  Skin:    General: Skin is warm and dry.     Capillary Refill: Capillary refill takes less than 2 seconds.  Neurological:     Mental Status: He is alert.     Comments: Alert. Clear speech. Sensation grossly intact to bilateral upper extremities. 5/5 symmetric grip strength. Ambulatory.   Psychiatric:        Mood and Affect: Mood normal.        Behavior: Behavior normal.    ED Results / Procedures / Treatments   Labs (all labs ordered are listed, but only abnormal results are displayed) Labs Reviewed - No data to display  EKG None  Radiology DG Humerus Right  Result Date:  08/08/2019 CLINICAL DATA:  Felt a pop in the upper biceps picking up his daughter earlier this morning. EXAM: RIGHT HUMERUS - 2+ VIEW COMPARISON:  None. FINDINGS: There is no evidence of fracture or other focal bone lesions. Soft tissues are unremarkable. IMPRESSION: Negative. Electronically Signed   By: Titus Dubin M.D.   On: 08/08/2019 12:57    Procedures Procedures (including critical care time)  SPLINT APPLICATION Date/Time: 1:91 PM Authorized by: Kennith Maes Consent: Verbal consent obtained. Risks and benefits: risks, benefits and alternatives were discussed Consent given by: patient Splint applied by: orthopedic technician Location details: RUE Splint type: sling immobilizer Supplies used: sling immobilizer.  Post-procedure: The splinted body part was neurovascularly unchanged following the procedure. Patient tolerance: Patient tolerated the procedure well with no immediate complications.   Medications Ordered in ED Medications - No data to display  ED Course  I have reviewed the triage vital signs and the nursing notes.  Pertinent labs & imaging results that were available during my care of the patient were reviewed by me and considered in my medical decision making (see chart for details).    MDM Rules/Calculators/A&P                     Patient presents to the emergency department with complaints of right upper arm pain after lifting his daughter with a pop sensation.  He is nontoxic, resting comfortably, vitals without significant abnormality, BP mildly elevated, doubt hypertensive emergency.  On exam patient has a palpable defect to the right biceps muscle concerning for a proximal biceps tendon rupture.  I ordered, reviewed and interpreted right humerus x-ray which shows no fractures.  He is neurovascularly intact distally.  There are no open wounds.  Plan for sling immobilizer and to discuss with orthopedic surgery.  15:15: CONSULT: Discussed with orthopedic  surgeon Dr. Rhoderick Moody agreement with sling & discharge, will relay patient information to office staff to facilitate follow up.   I discussed results, treatment plan, need for follow-up, and return precautions with the patient. Provided opportunity for questions, patient confirmed understanding and is in agreement with plan.   Final Clinical Impression(s) / ED Diagnoses Final diagnoses:  Rupture of right biceps tendon, initial encounter    Rx / DC Orders ED Discharge Orders         Ordered    naproxen (NAPROSYN) 500 MG tablet  2 times daily PRN     08/08/19 1443    HYDROcodone-acetaminophen (NORCO/VICODIN) 5-325 MG tablet  Every 6 hours PRN     08/08/19 1443           Shaqueena Mauceri, [place] R, PA-C 08/08/19  1447    Milagros Loll, MD 08/09/19 [id number]

## 2019-08-13 ENCOUNTER — Encounter: Payer: Self-pay | Admitting: Orthopaedic Surgery

## 2019-08-13 ENCOUNTER — Ambulatory Visit (INDEPENDENT_AMBULATORY_CARE_PROVIDER_SITE_OTHER): Payer: No Typology Code available for payment source | Admitting: Orthopaedic Surgery

## 2019-08-13 DIAGNOSIS — G8929 Other chronic pain: Secondary | ICD-10-CM | POA: Diagnosis not present

## 2019-08-13 DIAGNOSIS — M25511 Pain in right shoulder: Secondary | ICD-10-CM

## 2019-08-13 NOTE — Progress Notes (Signed)
Office Visit Note   Patient: Kevin Burch           Date of Birth: Jul 19, 1975           MRN: [id number] Visit Date: 08/13/2019              Requested by: Overton Mam, DO [address] [place],  [place] [address] PCP: Overton Mam, DO   Assessment & Plan: Visit Diagnoses:  1. Chronic right shoulder pain     Plan: My impression is proximal biceps myotendinous versus biceps tendon rupture.  I would like to obtain MRI of the right shoulder to fully evaluate for the extent of the injury.  Given that this is his dominant arm and he is only 44 years old he may benefit from biceps tenodesis based on MRI findings.  Follow-Up Instructions: Return if symptoms worsen or fail to improve.   Orders:  Orders Placed This Encounter  Procedures  . MR SHOULDER RIGHT WO CONTRAST   No orders of the defined types were placed in this encounter.     Procedures: No procedures performed   Clinical Data: No additional findings.   Subjective: Chief Complaint  Patient presents with  . Right Elbow - Pain    Kevin Burch is a 44 year old gentleman who comes in today for right arm pain.  He was reaching to pick up his daughter when he felt a pop in his right shoulder and proximal biceps area.  This occurred on 08/08/2019.  He reports no bruising or discoloration.  He is not taking anything for pain.  He has some slight discomfort with lifting.  He has to do a lot of lifting for work.   Review of Systems  Constitutional: Negative.   All other systems reviewed and are negative.    Objective: Vital Signs: There were no vitals taken for this visit.  Physical Exam Vitals and nursing note reviewed.  Constitutional:      Appearance: He is well-developed.  HENT:     Head: Normocephalic and atraumatic.  Eyes:     Pupils: Pupils are equal, round, and reactive to light.  Pulmonary:     Effort: Pulmonary effort is normal.  Abdominal:     Palpations: Abdomen is soft.    Musculoskeletal:        General: Normal range of motion.     Cervical back: Neck supple.  Skin:    General: Skin is warm.  Neurological:     Mental Status: He is alert and oriented to person, place, and time.  Psychiatric:        Behavior: Behavior normal.        Thought Content: Thought content normal.        Judgment: Judgment normal.     Ortho Exam Right shoulder shows full range of motion.  He has a small bruise in the proximal biceps region.  He has a slight tenderness in the myotendinous junction of the biceps.  No significant Popeye deformity.  Slight weakness secondary to pain. Specialty Comments:  No specialty comments available.  Imaging: No results found.   PMFS History: There are no problems to display for this patient.  Past Medical History:  Diagnosis Date  . Allergy     History reviewed. No pertinent family history.  Past Surgical History:  Procedure Laterality Date  . ENUCLEATION Right 12/2012  . UMBILICAL HERNIA REPAIR     Social History   Occupational History  . Not on file  Tobacco Use  . Smoking status: Current Every Day Smoker    Packs/day: 1.00    Types: Cigarettes  . Smokeless tobacco: Never Used  Substance and Sexual Activity  . Alcohol use: Yes    Comment: 5 x / week  . Drug use: Never  . Sexual activity: Not on file

## 2019-08-14 ENCOUNTER — Ambulatory Visit (HOSPITAL_COMMUNITY)
Admission: EM | Admit: 2019-08-14 | Discharge: 2019-08-14 | Disposition: A | Discharge status: Home / Self Care | Payer: No Typology Code available for payment source | Attending: Family Medicine | Admitting: Family Medicine

## 2019-08-14 ENCOUNTER — Encounter (HOSPITAL_COMMUNITY): Payer: Self-pay

## 2019-08-14 ENCOUNTER — Other Ambulatory Visit: Payer: Self-pay

## 2019-08-14 DIAGNOSIS — R21 Rash and other nonspecific skin eruption: Secondary | ICD-10-CM

## 2019-08-14 HISTORY — DX: Sickle-cell trait: D57.3

## 2019-08-14 MED ORDER — PERMETHRIN 5 % EX CREA: TOPICAL_CREAM | CUTANEOUS | 1 refills | Status: DC

## 2019-08-14 NOTE — ED Provider Notes (Signed)
[facility]   [id number] 08/14/19 Arrival Time: 1013  ASSESSMENT & PLAN:  1. Rash and nonspecific skin eruption     Meds ordered this encounter  Medications   permethrin (ELIMITE) 5 % cream    Sig: Apply from neck down before bed then wash off in the morning. May repeat in one week.    Dispense:  60 g    Refill:  1    Question possibility of chigger bites. Will still empirically treat for scabies. Discussed.  See AVS for written d/c information.  Reviewed expectations re: course of current medical issues. Questions answered. Outlined signs and symptoms indicating need for more acute intervention. Patient verbalized understanding. After Visit Summary given.   SUBJECTIVE:  Kevin Burch is a 44 y.o. male who presents with a skin complaint.   Location: generalized rash Onset: gradual Duration: 1-2 weeks; seen in ED 08/03/2019 (note reviewed); dx with contact dermatis; steroid cream given Associated pruritis? significant Associated pain? none Progression: feels this is increasing steadily  Drainage? none Known trigger? No  New soaps/lotions/topicals/detergents? No  Environmental exposures? No  but questions noticing shortly after mowing lawn Contacts with similar? No  Recent travel? No  Other associated symptoms: none Arthralgia or myalgia? none Recent illness? none Fever? none New medications? none No specific aggravating or alleviating factors reported.    OBJECTIVE: Vitals:   08/14/19 1036  BP: (!) 146/91  Pulse: 94  Resp: 17  Temp: 98.2 F (36.8 C)  TempSrc: Oral  SpO2: 98%    General appearance: alert; no distress HEENT: [place]; AT Neck: supple with FROM Lungs: clear to auscultation bilaterally Heart: regular rate and rhythm Extremities: no edema; moves all extremities normally Skin: warm and dry; signs of infection: no; generalized slightly erythematous papules over extremities and trunk mainly; few on forehead and scalp; no bleeding;  excoriated 1x1 cm area on left inner leg; no specific TTP; no bleeding Psychological: alert and cooperative; normal mood and affect  Allergies  Allergen Reactions   Doxycycline Other (See Comments)    Makes him feel like his heart is slow   Drug Class [Penicillins]     Reports is slows HR    Past Medical History:  Diagnosis Date   Allergy    Sickle cell trait (HCC)    Social History   Socioeconomic History   Marital status: Married    Spouse name: Not on file   Number of children: Not on file   Years of education: Not on file   Highest education level: Not on file  Occupational History   Not on file  Tobacco Use   Smoking status: Current Every Day Smoker    Packs/day: 1.00    Types: Cigarettes   Smokeless tobacco: Never Used  Substance and Sexual Activity   Alcohol use: Yes    Comment: 5 x / week   Drug use: Never   Sexual activity: Not on file  Other Topics Concern   Not on file  Social History Narrative   Not on file   Social Determinants of Health   Financial Resource Strain:    Difficulty of Paying Living Expenses:   Food Insecurity:    Worried About Programme researcher, broadcasting/film/video in the Last Year:    Barista in the Last Year:   Transportation Needs:    Lack of Transportation (Medical):    Lack of Transportation (Non-Medical):   Physical Activity:    Days of Exercise per Week:  Minutes of Exercise per Session:   Stress:    Feeling of Stress :   Social Connections:    Frequency of Communication with Friends and Family:    Frequency of Social Gatherings with Friends and Family:    Attends Religious Services:    Active Member of Clubs or Organizations:    Attends Archivist Meetings:    Marital Status:   Intimate Partner Violence:    Fear of Current or Ex-Partner:    Emotionally Abused:    Physically Abused:    Sexually Abused:    Family History  Problem Relation Age of Onset   Sleep apnea Mother     Past Surgical History:  Procedure Laterality Date   ENUCLEATION Right 66/0630   UMBILICAL HERNIA REPAIR       Vanessa Kick, MD 08/14/19 1324

## 2019-08-14 NOTE — ED Triage Notes (Signed)
Pt c/o itching rash to body. Areas of "lines" with red raised pinpoints as well noted between fingers, on arms/legs and behind knees. Pt also has large area of dried black skin (scab) to left calf with swelling and area of vesicular nodule with apparent clear liquid.  Pt states "I think it's scabies".  Pt has been using calamine lotion and corticosteroid creams w/o improvement.

## 2019-09-05 ENCOUNTER — Other Ambulatory Visit: Payer: Self-pay

## 2019-09-05 ENCOUNTER — Emergency Department (HOSPITAL_COMMUNITY)
Admission: EM | Admit: 2019-09-05 | Discharge: 2019-09-05 | Disposition: A | Discharge status: Home / Self Care | Payer: No Typology Code available for payment source | Attending: Emergency Medicine | Admitting: Emergency Medicine

## 2019-09-05 ENCOUNTER — Encounter (HOSPITAL_COMMUNITY): Payer: Self-pay | Admitting: Emergency Medicine

## 2019-09-05 DIAGNOSIS — F17210 Nicotine dependence, cigarettes, uncomplicated: Secondary | ICD-10-CM | POA: Insufficient documentation

## 2019-09-05 DIAGNOSIS — L299 Pruritus, unspecified: Secondary | ICD-10-CM | POA: Diagnosis not present

## 2019-09-05 DIAGNOSIS — R21 Rash and other nonspecific skin eruption: Secondary | ICD-10-CM | POA: Diagnosis present

## 2019-09-05 DIAGNOSIS — L0103 Bullous impetigo: Secondary | ICD-10-CM | POA: Diagnosis not present

## 2019-09-05 MED ORDER — CEPHALEXIN 500 MG PO CAPS: 500.0000 mg | ORAL_CAPSULE | Freq: Four times a day (QID) | ORAL | 0 refills | Status: DC

## 2019-09-05 NOTE — ED Triage Notes (Signed)
Pt here from home with c/o rash that has been ongoing since may , pt has been on steroids but it has not helped

## 2019-09-05 NOTE — ED Provider Notes (Signed)
[facility] EMERGENCY DEPARTMENT Provider Note   CSN: [id number] Arrival date & time: 09/05/19  4097     History No chief complaint on file.   Kevin Burch is a 44 y.o. male.   Rash Location:  Full body Quality: blistering, itchiness and scaling   Severity:  Moderate Onset quality:  Gradual Timing:  Constant Progression:  Spreading Context comment:  Unclear origin Relieved by:  Nothing Exacerbated by: steroid cream. Ineffective treatments: scabies treatment x2. Associated symptoms: no abdominal pain, no fatigue, no fever, no joint pain, no periorbital edema, no shortness of breath, no sore throat, no throat swelling, no tongue swelling, not vomiting and not wheezing        Past Medical History:  Diagnosis Date  . Allergy   . Sickle cell trait (HCC)     There are no problems to display for this patient.   Past Surgical History:  Procedure Laterality Date  . ENUCLEATION Right 12/2012  . UMBILICAL HERNIA REPAIR         Family History  Problem Relation Age of Onset  . Sleep apnea Mother     Social History   Tobacco Use  . Smoking status: Current Every Day Smoker    Packs/day: 1.00    Types: Cigarettes  . Smokeless tobacco: Never Used  Substance Use Topics  . Alcohol use: Yes    Comment: 5 x / week  . Drug use: Never    Home Medications Prior to Admission medications   Medication Sig Start Date End Date Taking? Authorizing Provider  cephALEXin (KEFLEX) 500 MG capsule Take 1 capsule (500 mg total) by mouth 4 (four) times daily. 09/05/19   Sabino Donovan, MD  hydrocortisone 2.5 % lotion Apply topically 2 (two) times daily. 08/03/19   Lorelee New, PA-C  naproxen (NAPROSYN) 500 MG tablet Take 1 tablet (500 mg total) by mouth 2 (two) times daily as needed. 08/08/19   Petrucelli, Samantha R, PA-C  permethrin (ELIMITE) 5 % cream Apply from neck down before bed then wash off in the morning. May repeat in one week. 08/14/19   Mardella Layman, MD     Allergies    Doxycycline and Drug class [penicillins]  Review of Systems   Review of Systems  Constitutional: Negative for chills, fatigue and fever.  HENT: Negative for ear pain and sore throat.   Eyes: Negative for pain and visual disturbance.  Respiratory: Negative for cough, shortness of breath and wheezing.   Cardiovascular: Negative for chest pain and palpitations.  Gastrointestinal: Negative for abdominal pain and vomiting.  Genitourinary: Negative for dysuria and hematuria.  Musculoskeletal: Negative for arthralgias and back pain.  Skin: Positive for rash. Negative for color change.  Neurological: Negative for seizures and syncope.  All other systems reviewed and are negative.   Physical Exam Updated Vital Signs BP (!) 154/94 (BP Location: Left Arm)   Pulse 91   Temp 98.4 F (36.9 C) (Oral)   Resp 18   Ht 5\' 11"  (1.803 m)   Wt 104.3 kg   SpO2 96%   BMI 32.08 kg/m   Physical Exam Vitals and nursing note reviewed. Exam conducted with a chaperone present.  Constitutional:      Appearance: He is well-developed.  HENT:     Head: Normocephalic and atraumatic.  Eyes:     Conjunctiva/sclera: Conjunctivae normal.  Cardiovascular:     Rate and Rhythm: Normal rate and regular rhythm.     Heart sounds: No murmur heard.  Pulmonary:     Effort: Pulmonary effort is normal. No respiratory distress.     Breath sounds: Normal breath sounds.  Abdominal:     Palpations: Abdomen is soft.     Tenderness: There is no abdominal tenderness.  Musculoskeletal:     Cervical back: Neck supple.  Skin:    General: Skin is warm and dry.     Capillary Refill: Capillary refill takes less than 2 seconds.     Findings: Rash present. Rash is crusting, papular and scaling.     Comments: Not ttp, no drainage, no fluctuance, no induration, scattered throughout entire body including face, not reported on genitalia   Neurological:     Mental Status: He is alert.     ED Results /  Procedures / Treatments   Labs (all labs ordered are listed, but only abnormal results are displayed) Labs Reviewed - No data to display  EKG None  Radiology No results found.  Procedures Procedures (including critical care time)  Medications Ordered in ED Medications - No data to display  ED Course  I have reviewed the triage vital signs and the nursing notes.  Pertinent labs & imaging results that were available during my care of the patient were reviewed by me and considered in my medical decision making (see chart for details).    MDM Rules/Calculators/A&P                          Healthy 43yM with rash for several weeks, slowly worsening, no cellulitic and no signs of abscess, not sure what may have caused it, tried and failed scabies treatment twice, waiting on biopsy results from 2 weeks ago, will treat for bullus impetigo for now with keflex, pt has tolerated amox before though there is a penicilin allergy. Return precautions given and dc instructions given.   Final Clinical Impression(s) / ED Diagnoses Final diagnoses:  Bullous impetigo    Rx / DC Orders ED Discharge Orders         Ordered    cephALEXin (KEFLEX) 500 MG capsule  4 times daily     Discontinue  Reprint     09/05/19 1031           Sabino Donovan, MD 09/05/19 1043

## 2019-09-05 NOTE — ED Notes (Signed)
Pt called for room with no answer, pt not in lobby or restrooms

## 2019-09-14 ENCOUNTER — Other Ambulatory Visit: Payer: No Typology Code available for payment source

## 2020-06-08 ENCOUNTER — Ambulatory Visit (INDEPENDENT_AMBULATORY_CARE_PROVIDER_SITE_OTHER): Payer: No Typology Code available for payment source | Admitting: Allergy

## 2020-06-08 ENCOUNTER — Encounter: Payer: Self-pay | Admitting: Allergy

## 2020-06-08 ENCOUNTER — Other Ambulatory Visit: Payer: Self-pay

## 2020-06-08 VITALS — BP 102/70 | HR 99 | Temp 98.2°F | Resp 18 | Ht 69.0 in | Wt 212.8 lb

## 2020-06-08 DIAGNOSIS — L299 Pruritus, unspecified: Secondary | ICD-10-CM

## 2020-06-08 DIAGNOSIS — L2089 Other atopic dermatitis: Secondary | ICD-10-CM | POA: Diagnosis not present

## 2020-06-08 DIAGNOSIS — L239 Allergic contact dermatitis, unspecified cause: Secondary | ICD-10-CM | POA: Diagnosis not present

## 2020-06-08 DIAGNOSIS — R21 Rash and other nonspecific skin eruption: Secondary | ICD-10-CM

## 2020-06-08 NOTE — Progress Notes (Signed)
New Patient Note  RE: Kevin Burch MRN: [id number] DOB: 1975/09/16 Date of Office Visit: 06/08/2020  Consult requested by: Benjiman Core* Primary care provider: Overton Mam, DO  Chief Complaint: Allergy Testing  History of Present Illness: I had the pleasure of seeing Kevin Burch for initial evaluation at the [facility] on 06/08/2020. He is a 45 y.o. male, who is referred here by Overton Mam, DO for the evaluation of rash.  Rash started about 1 year ago. Mainly occurs on his legs (below the knee), arms (from elbows to hands), and the neck. Describes them as itchy, raised, bumpy, flesh colored and not red. Sometimes it blisters and oozes. Some of the rashes have been there since onset with hyperpigmented skin changes. Associated symptoms include: none. Suspected triggers are unknown but was concerned about the house he moved in as this started about 1 month after he moved into his new home. Then he questioned whether he came across something in the gutters when he was cleaning it. Then he thought it was the baby oil he was using to help the rash. No one else has this rash at home.  Denies any fevers, chills, changes in medications, foods, personal care products or recent infections. He has tried the following therapies: clobetasol with some benefit. Systemic steroids: kenalog with no improvement in symptoms but [facility] states that it helped. Currently on no daily medications. Itching occurs on a daily basis.  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 He also saw allergy and planning on starting Dupixent possibly.  Previous history of rash/hives: no. Patient is up to date with the following cancer screening tests: physical exam and no history of cancers. No recent bloodwork.   Assessment and Plan: Irby is a 46 y.o. male with: Rash and other nonspecific skin  eruption Pruritic rash for 1 year mainly on the lower/upper extremities and the neck. They are becoming hyperpigmented and sometimes blister as well. 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 Currently using topical steroid creams with some benefit but no complete resolution. Systemic steroids did not help either. Patient concerned about allergic triggers.   Discussed with patient that his rash does not seem to be triggered by environmental/food allergies - he would still like to be checked for this and will order bloodwork as we are using his back for patch testing today.   He most likely has some atopic dermatitis but I'm concerned if there is also a component of contact dermatitis.  Get bloodwork to rule out other etiologies.   Start proper skin care as below.  TRUE patches places today.   Consider starting Dupixent.   Return in about 2 days (around 06/10/2020) for Patch reading.  Follow up in 1 month for regular office visit.   No orders of the defined types were placed in this encounter.   Lab Orders     Allergens w/Total IgE Area 2     Alpha-Gal Panel     CBC with Differential/Platelet     Comprehensive metabolic panel     C-reactive protein     Sedimentation rate     Tryptase     Thyroid Cascade Profile     IgE Food Prof w/Component Rflx  Other allergy screening: Asthma: no Rhino conjunctivitis:   Symptoms as a child Food allergy: no Medication allergy: yes  Doxycycline Hymenoptera allergy: no History of recurrent infections suggestive of immunodeficency: no  Diagnostics: Results discussed with patient/family.  T.R.U.E. Test - 06/08/20 1451    Time Antigen Placed 1451    Manufacturer --   smart practice   Lot # [id number]    Location Back    Number of Test 36    Reading Interval Day 1           Past Medical History: Patient Active Problem List   Diagnosis Date Noted  . Rash and other nonspecific skin eruption 06/08/2020  . Pruritus 06/08/2020   . 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 Other atopic dermatitis 06/08/2020   Past Medical History:  Diagnosis Date  . Allergy   . Sickle cell trait [facility])    Past Surgical History: Past Surgical History:  Procedure Laterality Date  . ENUCLEATION Right 12/2012  . UMBILICAL HERNIA REPAIR     Medication List:  Current Outpatient Medications  Medication Sig Dispense Refill  . hydrocortisone 2.5 % lotion Apply topically 2 (two) times daily. (Patient not taking: Reported on 06/08/2020) 59 mL 0  . naproxen (NAPROSYN) 500 MG tablet Take 1 tablet (500 mg total) by mouth 2 (two) times daily as needed. (Patient not taking: Reported on 06/08/2020) 15 tablet 0   No current facility-administered medications for this visit.   Allergies: Allergies  Allergen Reactions  . Doxycycline Other (See Comments)    Makes him feel like his heart is slow  . Drug Class [Penicillins]     Reports is slows HR   Social History: Social History   Socioeconomic History  . Marital status: Married    Spouse name: Not on file  . Number of children: Not on file  . Years of education: Not on file  . Highest education level: Not on file  Occupational History  . Not on file  Tobacco Use  . Smoking status: Current Every Day Smoker    Packs/day: 1.00    Types: Cigarettes  . Smokeless tobacco: Never Used  Substance and Sexual Activity  . Alcohol use: Yes    Comment: 5 x / week  . Drug use: Never  . Sexual activity: Not on file  Other Topics Concern  . Not on file  Social History Narrative  . Not on file   Social Determinants of Health   Financial Resource Strain: Not on file  Food Insecurity: Not on file  Transportation Needs: Not on file  Physical Activity: Not on file  Stress: Not on file  Social Connections: Not on file   Lives in a house built in 2016. Smoking: 1/2 pack per day Occupation: not employed  Environmental HistorySurveyor, minerals in the house: yes Carpet in the family  room: no Carpet in the bedroom: yes Heating: gas Cooling: central Pet: no  Family History: Family History  Problem Relation Age of Onset  . Sleep apnea Mother    Problem                               Relation Asthma                                   No  Eczema                                No  Food allergy  No  Allergic rhino conjunctivitis     No   Review of Systems  Constitutional: Negative for appetite change, chills, fever and unexpected weight change.  HENT: Negative for congestion and rhinorrhea.   Eyes: Negative for itching.  Respiratory: Negative for cough, chest tightness, shortness of breath and wheezing.   Cardiovascular: Negative for chest pain.  Gastrointestinal: Negative for abdominal pain.  Genitourinary: Negative for difficulty urinating.  Skin: Positive for rash.  Neurological: Negative for headaches.   Objective: BP 102/70 (BP Location: Right Arm, Patient Position: Sitting, Cuff Size: Large)   Pulse 99   Temp 98.2 F (36.8 C) (Temporal)   Resp 18   Ht 5\' 9"  (1.753 m)   Wt 212 lb 12.8 oz (96.5 kg)   SpO2 97%   BMI 31.43 kg/m  Body mass index is 31.43 kg/m. Physical Exam Vitals and nursing note reviewed.  Constitutional:      Appearance: Normal appearance. He is well-developed.  HENT:     Head: Normocephalic and atraumatic.     Right Ear: Tympanic membrane and external ear normal.     Left Ear: Tympanic membrane and external ear normal.     Nose: Nose normal.     Mouth/Throat:     Mouth: Mucous membranes are moist.     Pharynx: Oropharynx is clear.  Cardiovascular:     Rate and Rhythm: Normal rate and regular rhythm.     Heart sounds: Normal heart sounds. No murmur heard. No friction rub. No gallop.   Pulmonary:     Effort: Pulmonary effort is normal.     Breath sounds: Normal breath sounds. No wheezing, rhonchi or rales.  Musculoskeletal:     Cervical back: Neck supple.  Skin:    General: Skin is warm.      Findings: Rash present.     Comments: Scattered hyperpigmented rash on the upper and lower extremities b/l including dorsum of the hands.   Neurological:     Mental Status: He is alert and oriented to person, place, and time.  Psychiatric:        Behavior: Behavior normal.    The plan was reviewed with the patient/family, and all questions/concerned were addressed.  It was my pleasure to see Kevin Burch today and participate in his care. Please feel free to contact me with any questions or concerns.  Sincerely,  Jonny Ruiz, DO Allergy & Immunology  [facility] office: [phone] [facility] office: [phone]

## 2020-06-08 NOTE — Assessment & Plan Note (Addendum)
Pruritic rash for 1 year mainly on the lower/upper extremities and the neck. They are becoming hyperpigmented and sometimes blister as well. 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 Currently using topical steroid creams with some benefit but no complete resolution. Systemic steroids did not help either. Patient concerned about allergic triggers.   Discussed with patient that his rash does not seem to be triggered by environmental/food allergies - he would still like to be checked for this and will order bloodwork as we are using his back for patch testing today.   He most likely has some atopic dermatitis but I'm concerned if there is also a component of contact dermatitis.  Get bloodwork to rule out other etiologies.   Start proper skin care as below.  TRUE patches places today.   Consider starting Dupixent.

## 2020-06-08 NOTE — Patient Instructions (Addendum)
Rash: Not sure what's causing your rash.  Get bloodwork:  We are ordering labs, so please allow 1-2 weeks for the results to come back. With the newly implemented Cures Act, the labs might be visible to you at the same time that they become visible to me. However, I will not address the results until all of the results are back, so please be patient.   Start proper skin care as below.  . Patches placed today. . Please avoid strenuous physical activities and do not get the patches on the back wet. No showering until final patch reading done. Molli Knock to take antihistamines for itching but avoid placing any creams on the back where the patches are. . We will remove the patches on Wednesday and will do our initial read. . Then you will come back on Friday for a final read.  Follow up on Wednesday/Friday for patch reading.

## 2020-06-09 LAB — COMPREHENSIVE METABOLIC PANEL
AST: 24 IU/L (ref 0–40)
Albumin/Globulin Ratio: 1.6 (ref 1.2–2.2)
Albumin: 4.7 g/dL (ref 4.0–5.0)
Creatinine, Ser: 1 mg/dL (ref 0.76–1.27)
eGFR: 96 mL/min/{1.73_m2} (ref >59)

## 2020-06-09 LAB — C-REACTIVE PROTEIN: CRP: 2 mg/L (ref 0–10)

## 2020-06-09 LAB — CBC WITH DIFFERENTIAL/PLATELET
Immature Granulocytes: 2 %
Neutrophils Absolute: 4.1 10*3/uL (ref 1.4–7.0)

## 2020-06-09 LAB — ALLERGENS W/TOTAL IGE AREA 2

## 2020-06-10 ENCOUNTER — Other Ambulatory Visit: Payer: Self-pay

## 2020-06-10 ENCOUNTER — Ambulatory Visit: Payer: No Typology Code available for payment source | Admitting: Allergy

## 2020-06-10 ENCOUNTER — Encounter: Payer: Self-pay | Admitting: Allergy

## 2020-06-10 DIAGNOSIS — R21 Rash and other nonspecific skin eruption: Secondary | ICD-10-CM

## 2020-06-10 DIAGNOSIS — L299 Pruritus, unspecified: Secondary | ICD-10-CM

## 2020-06-10 LAB — COMPREHENSIVE METABOLIC PANEL
BUN/Creatinine Ratio: 12 (ref 9–20)
Calcium: 9.6 mg/dL (ref 8.7–10.2)

## 2020-06-10 LAB — ALLERGENS W/TOTAL IGE AREA 2

## 2020-06-10 LAB — ALPHA-GAL PANEL

## 2020-06-10 LAB — CBC WITH DIFFERENTIAL/PLATELET
Basos: 1 %
EOS (ABSOLUTE): 0.1 10*3/uL (ref 0.0–0.4)
Platelets: 293 10*3/uL (ref 150–450)

## 2020-06-10 NOTE — Progress Notes (Signed)
    Follow-up Note  RE: Kevin Burch MRN: [id number] DOB: 10-05-1975 Date of Office Visit: 06/10/2020  Primary care provider: Overton Mam, DO Referring provider: Overton Mam, DO   [name] returns to the office today for the initial patch test interpretation, given suspected history of contact dermatitis.    Diagnostics:  TRUE TEST 48 hour reading: negative  Plan:  He will return in 2 days for final reading.  [name], MD [facility]

## 2020-06-12 ENCOUNTER — Ambulatory Visit (INDEPENDENT_AMBULATORY_CARE_PROVIDER_SITE_OTHER): Payer: No Typology Code available for payment source | Admitting: Allergy

## 2020-06-12 ENCOUNTER — Other Ambulatory Visit: Payer: Self-pay

## 2020-06-12 ENCOUNTER — Encounter: Payer: Self-pay | Admitting: Allergy

## 2020-06-12 VITALS — Temp 98.0°F

## 2020-06-12 DIAGNOSIS — R21 Rash and other nonspecific skin eruption: Secondary | ICD-10-CM | POA: Diagnosis not present

## 2020-06-12 DIAGNOSIS — L299 Pruritus, unspecified: Secondary | ICD-10-CM

## 2020-06-12 LAB — SEDIMENTATION RATE: Sed Rate: 10 mm/hr (ref 0–15)

## 2020-06-12 LAB — ALLERGENS W/TOTAL IGE AREA 2
Alternaria Alternata IgE: 2.36 kU/L — AB
Bermuda Grass IgE: 1.6 kU/L — AB
Cladosporium Herbarum IgE: 4.21 kU/L — AB
Common Silver Birch IgE: <0.1 kU/L
Cottonwood IgE: <0.1 kU/L
D Farinae IgE: 5 kU/L — AB
D Pteronyssinus IgE: 5.15 kU/L — AB
Dog Dander IgE: 0.17 kU/L — AB
Elm, American IgE: 0.19 kU/L — AB
Johnson Grass IgE: 2.17 kU/L — AB
Mouse Urine IgE: <0.1 kU/L
Oak, White IgE: <0.1 kU/L
Pecan, Hickory IgE: <0.1 kU/L
Penicillium Chrysogen IgE: 0.88 kU/L — AB
Pigweed, Rough IgE: <0.1 kU/L
Ragweed, Short IgE: 1.21 kU/L — AB

## 2020-06-12 LAB — CBC WITH DIFFERENTIAL/PLATELET
Basophils Absolute: 0.1 10*3/uL (ref 0.0–0.2)
Eos: 2 %
Hematocrit: 47.8 % (ref 37.5–51.0)
Hemoglobin: 15.7 g/dL (ref 13.0–17.7)
Immature Grans (Abs): 0.1 10*3/uL (ref 0.0–0.1)
Lymphocytes Absolute: 2 10*3/uL (ref 0.7–3.1)
Lymphs: 29 %
MCH: 27.4 pg (ref 26.6–33.0)
MCHC: 32.8 g/dL (ref 31.5–35.7)
MCV: 83 fL (ref 79–97)
Monocytes Absolute: 0.5 10*3/uL (ref 0.1–0.9)
Monocytes: 7 %
Neutrophils: 59 %
RBC: 5.74 x10E6/uL (ref 4.14–5.80)
RDW: 14.1 % (ref 11.6–15.4)
WBC: 6.8 10*3/uL (ref 3.4–10.8)

## 2020-06-12 LAB — ALPHA-GAL PANEL
Allergen Lamb IgE: <0.1 kU/L
Beef IgE: <0.1 kU/L
O215-IgE Alpha-Gal: <0.1 kU/L
Pork IgE: 0.11 kU/L — AB

## 2020-06-12 LAB — COMPREHENSIVE METABOLIC PANEL
ALT: 39 IU/L (ref 0–44)
Alkaline Phosphatase: 124 IU/L — ABNORMAL HIGH (ref 44–121)
BUN: 12 mg/dL (ref 6–24)
Bilirubin Total: 0.7 mg/dL (ref 0.0–1.2)
CO2: 23 mmol/L (ref 20–29)
Chloride: 102 mmol/L (ref 96–106)
Globulin, Total: 3 g/dL (ref 1.5–4.5)
Glucose: 85 mg/dL (ref 65–99)
Potassium: 4.4 mmol/L (ref 3.5–5.2)
Sodium: 141 mmol/L (ref 134–144)
Total Protein: 7.7 g/dL (ref 6.0–8.5)

## 2020-06-12 LAB — IGE FOOD PROF W/COMPONENT RFLX
Allergen Corn, IgE: <0.1 kU/L
Clam IgE: <0.1 kU/L
Codfish IgE: <0.1 kU/L
F001-IgE Egg White: <0.1 kU/L
F002-IgE Milk: <0.1 kU/L
Peanut, IgE: <0.1 kU/L
Scallop IgE: <0.1 kU/L
Sesame Seed IgE: <0.1 kU/L
Shrimp IgE: <0.1 kU/L
Soybean IgE: <0.1 kU/L
Walnut IgE: <0.1 kU/L
Wheat IgE: 0.23 kU/L — AB

## 2020-06-12 LAB — TRYPTASE: Tryptase: 5.5 ug/L (ref 2.2–13.2)

## 2020-06-12 LAB — THYROID CASCADE PROFILE: TSH: 1.05 u[IU]/mL (ref 0.450–4.500)

## 2020-06-12 NOTE — Progress Notes (Signed)
    Follow-up Note  RE: Kevin Burch MRN: [id number] DOB: September 04, 1975 Date of Office Visit: 06/12/2020  Primary care provider: Overton Mam, DO Referring provider: Overton Mam, DO   [name] returns to the office today for the final patch test interpretation, given suspected history of contact dermatitis.    Diagnostics:  TRUE TEST 96 hour reading: Negative  TRUE TEST 48 hour reading: Negative  Plan:  Dermatitis -Patch testing is negative -He will keep follow-up in a month with Dr. Thomasene Mohair, MD [facility]

## 2020-07-13 ENCOUNTER — Ambulatory Visit: Payer: No Typology Code available for payment source | Admitting: Allergy

## 2020-07-29 ENCOUNTER — Telehealth: Payer: Self-pay | Admitting: Allergy

## 2020-07-29 NOTE — Telephone Encounter (Signed)
Called and left a voicemail asking for patient to return call to review lab work.

## 2020-07-29 NOTE — Telephone Encounter (Signed)
Patient came in person asking for his lab results. Patient was unable to keep his appointment 07/13/2020. Patient is requesting that someone call him to discuss lab results. Best number to contact patient: [phone]  Please advise.

## 2020-07-30 NOTE — Telephone Encounter (Signed)
Patient returned call to discuss lab work. Is requesting for someone to call him back as soon as they can.

## 2020-07-30 NOTE — Telephone Encounter (Signed)
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

## 2020-07-30 NOTE — Telephone Encounter (Signed)
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 He does not have any other questions or concerns at this time.

## 2020-07-30 NOTE — Telephone Encounter (Signed)
Called and spoke to patient about his lab work. Patient expressed understanding and stated that he wanted in writing the avoidance measures and he'd pick them up in office due to home address changing next week. Patient questioned if he suffered from lichen planus and could this cause issues with his allergies. I informed patient that "d give this message to Dr. Selena Batten to address. Please advise on this health condition. Thank you

## 2020-08-24 ENCOUNTER — Emergency Department (HOSPITAL_COMMUNITY)
Admission: EM | Admit: 2020-08-24 | Discharge: 2020-08-25 | Disposition: A | Discharge status: Home / Self Care | Payer: No Typology Code available for payment source | Attending: Emergency Medicine | Admitting: Emergency Medicine

## 2020-08-24 DIAGNOSIS — M79674 Pain in right toe(s): Secondary | ICD-10-CM | POA: Diagnosis not present

## 2020-08-24 DIAGNOSIS — Z5321 Procedure and treatment not carried out due to patient leaving prior to being seen by health care provider: Secondary | ICD-10-CM | POA: Insufficient documentation

## 2020-08-24 NOTE — ED Triage Notes (Signed)
Pt reports R middle toe pain since last August. He is wondering if we could extract some blood from it. Also would like an STD test.

## 2020-08-25 IMAGING — CR DG HUMERUS 2V *R*
2 series · 2 of 2 positions shown · non-contrast
Comparison: None.

CLINICAL DATA: Felt a pop in the upper biceps picking up his
daughter earlier this morning.

EXAM:
RIGHT HUMERUS - 2+ VIEW

[w humerus ap right]
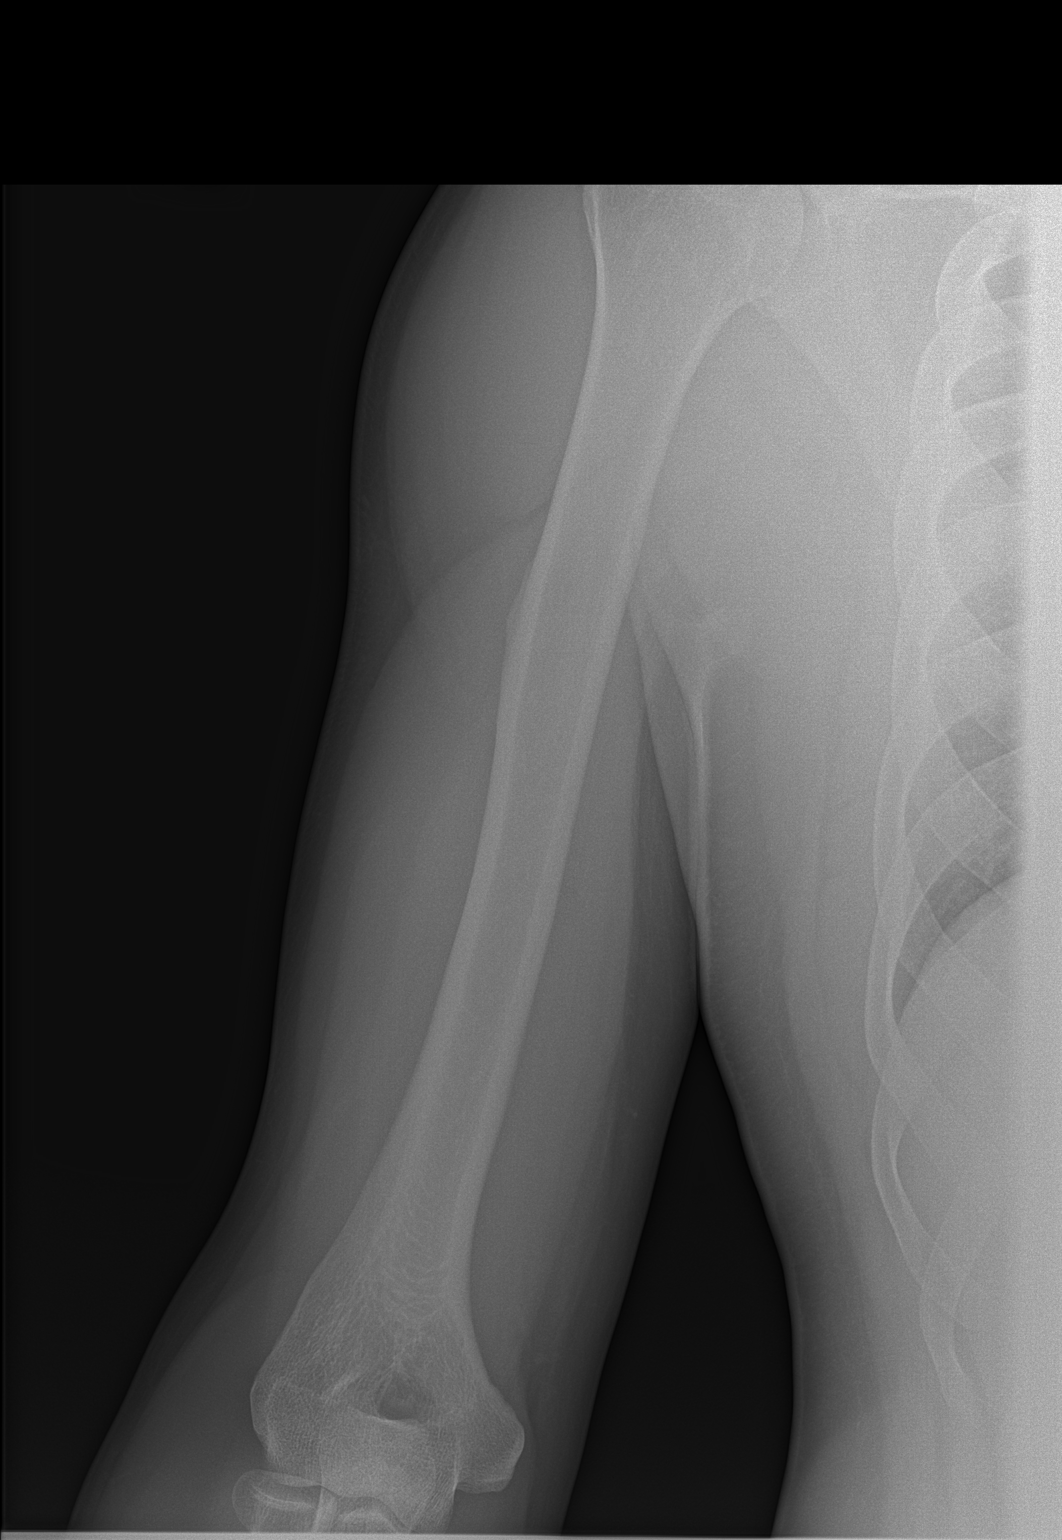

[w humerus lat right]
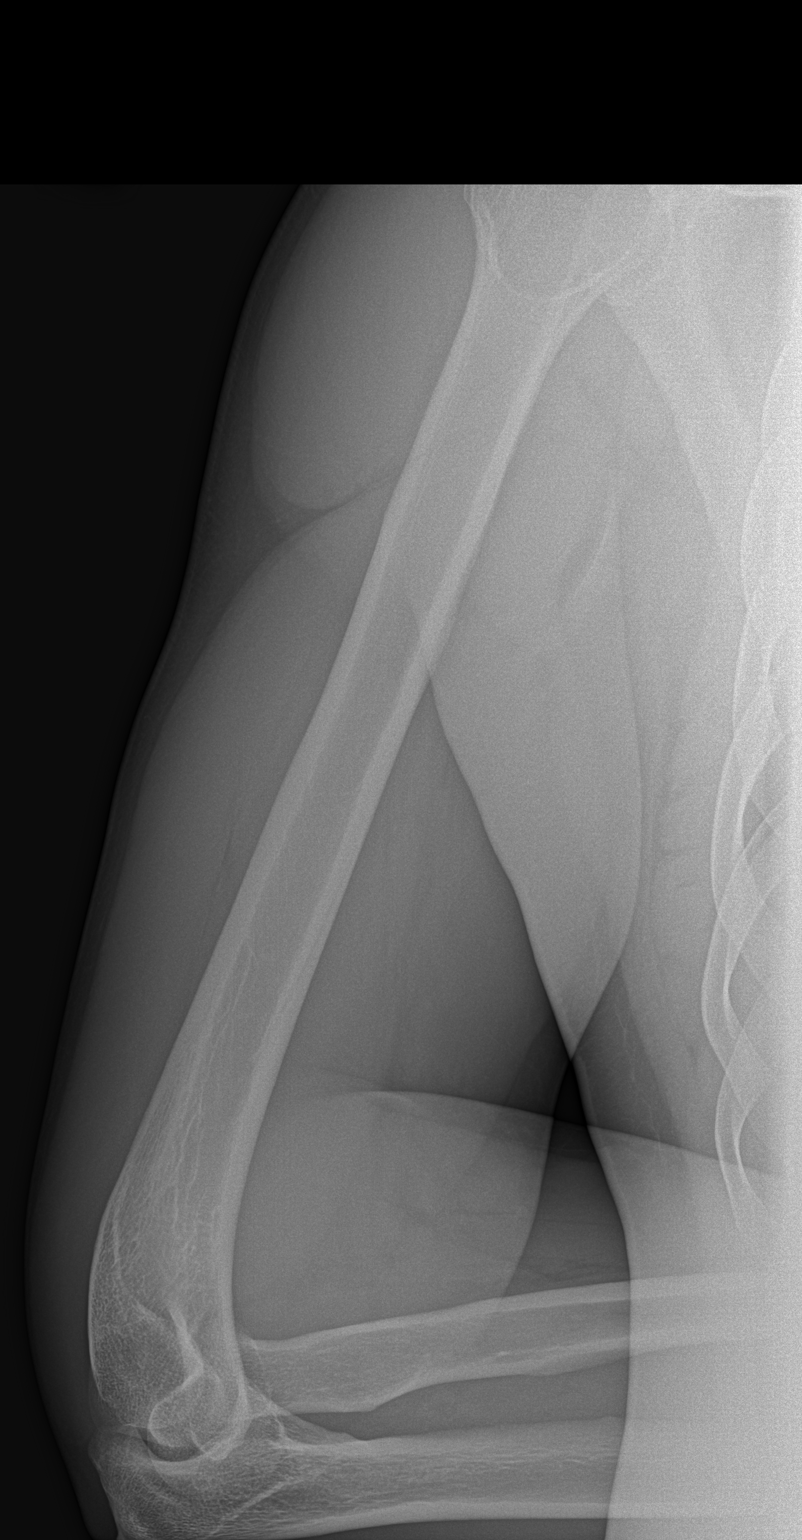

[2 of 2 positions shown; findings below may reference images not displayed]

FINDINGS: There is no evidence of fracture or other focal bone lesions. Soft
tissues are unremarkable.
IMPRESSION: Negative.

## 2020-11-27 NOTE — Telephone Encounter (Signed)
Called and left a message for patient to inform him that his avoidance measures as well as his lab results were still in our office and have yet to be picked up. I did express that they should be picked up by next week to avoid the papers and labs to be discarded and a possible fee is asked for results at a later time down the line.

## 2020-11-27 NOTE — Telephone Encounter (Signed)
Called and left another message asking patient to call our office to see if he wanted the papers mailed out to him to avoid his papers being discarded.
